# Patient Record
Sex: Male | Born: 1955 | Race: Black or African American | Hispanic: No | Marital: Single | Smoking: Never smoker
Health system: Southern US, Community
[De-identification: ages and names within clinical notes are randomized; demographics above are authoritative.]

## PROBLEM LIST (undated history)

## (undated) HISTORY — PX: HIP RESURFACING: SHX1760

---

## 2003-08-26 ENCOUNTER — Ambulatory Visit (HOSPITAL_COMMUNITY): Admission: RE | Admit: 2003-08-26 | Discharge: 2003-08-26 | Payer: Self-pay | Admitting: Pulmonary Disease

## 2005-12-20 ENCOUNTER — Ambulatory Visit (HOSPITAL_COMMUNITY): Admission: RE | Admit: 2005-12-20 | Discharge: 2005-12-20 | Payer: Self-pay | Admitting: Pulmonary Disease

## 2006-08-01 ENCOUNTER — Emergency Department (HOSPITAL_COMMUNITY): Admission: EM | Admit: 2006-08-01 | Discharge: 2006-08-01 | Payer: Self-pay | Admitting: Emergency Medicine

## 2006-11-08 ENCOUNTER — Ambulatory Visit (HOSPITAL_COMMUNITY): Admission: RE | Admit: 2006-11-08 | Discharge: 2006-11-08 | Payer: Self-pay | Admitting: Pulmonary Disease

## 2007-02-19 ENCOUNTER — Inpatient Hospital Stay (HOSPITAL_COMMUNITY): Admission: EM | Admit: 2007-02-19 | Discharge: 2007-02-26 | Payer: Self-pay | Admitting: Emergency Medicine

## 2007-02-20 ENCOUNTER — Encounter (INDEPENDENT_AMBULATORY_CARE_PROVIDER_SITE_OTHER): Payer: Self-pay | Admitting: Gastroenterology

## 2007-02-24 ENCOUNTER — Encounter (INDEPENDENT_AMBULATORY_CARE_PROVIDER_SITE_OTHER): Payer: Self-pay | Admitting: Interventional Radiology

## 2007-03-03 ENCOUNTER — Ambulatory Visit: Payer: Self-pay | Admitting: Oncology

## 2008-09-22 IMAGING — US US RENAL
1 series · 14 of 20 positions shown · non-contrast
Comparison: None.

CLINICAL DATA: 51-year-old, dehydration, gastroenteritis, elevated BUN and creatinine.  Acute renal failure.
 RENAL/URINARY TRACT ULTRASOUND:
TECHNIQUE: Complete ultrasound examination of the urinary tract was performed including evaluation of the kidneys, renal collecting systems, and urinary bladder.

[Series 1: unknown · 0.33mm/px · 14 of 20 slices shown]
[im 1/20]
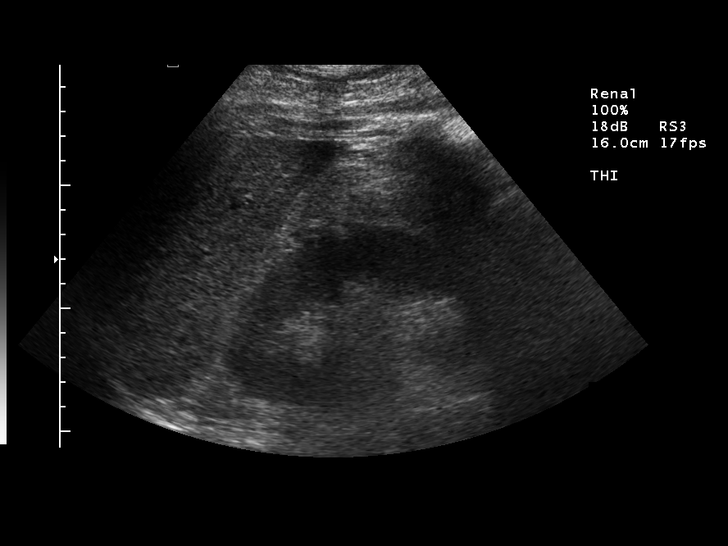
[im 3/20]
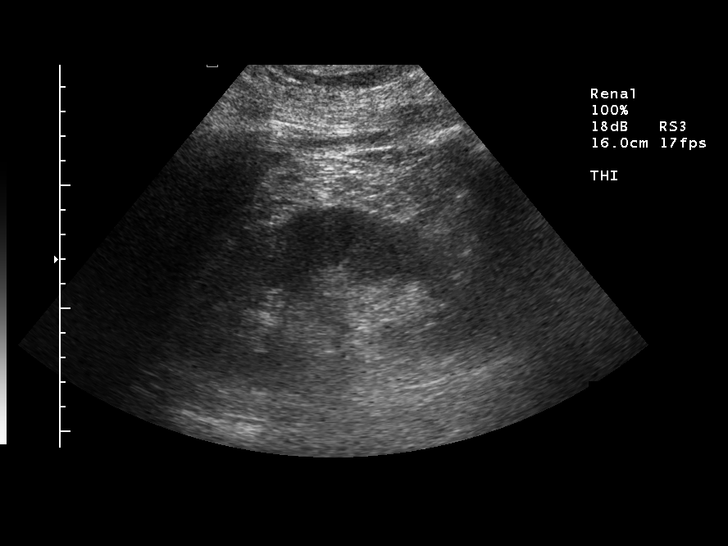
[im 4/20]
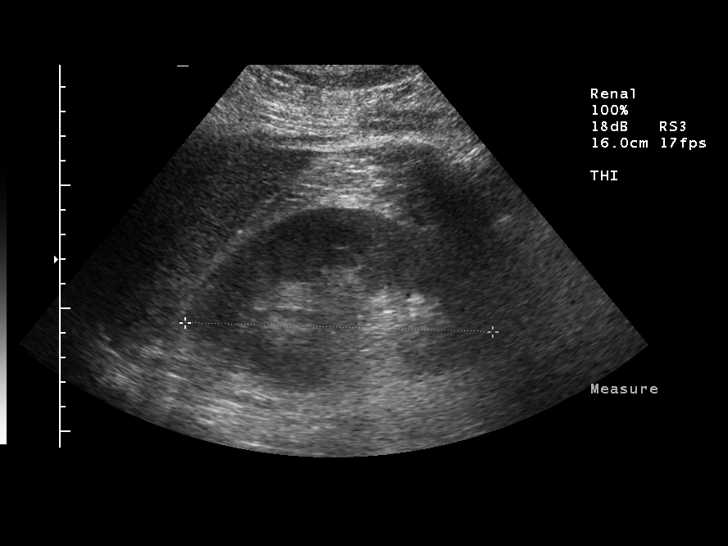
[im 6/20]
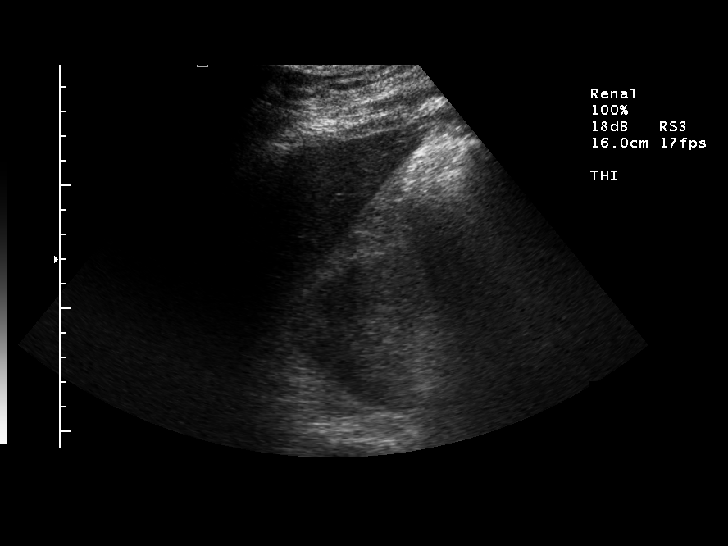
[im 7/20]
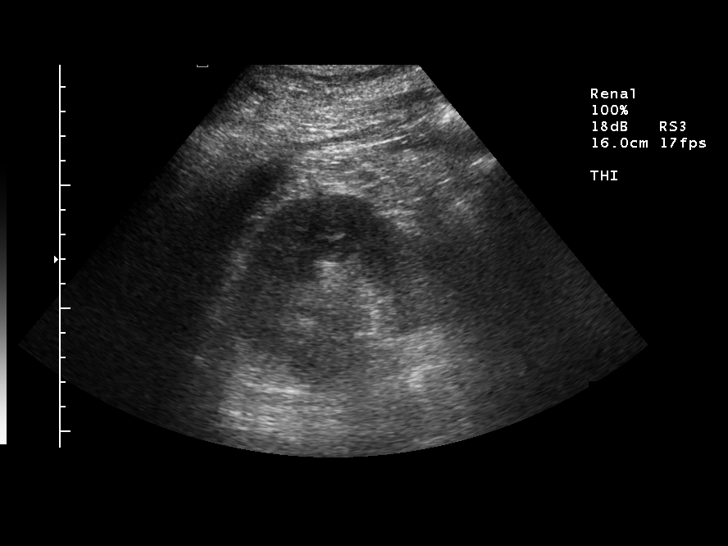
[im 8/20]
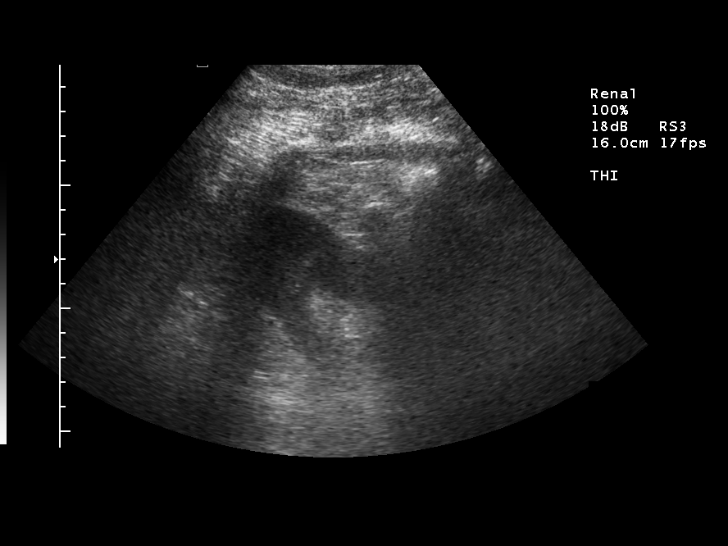
[im 10/20]
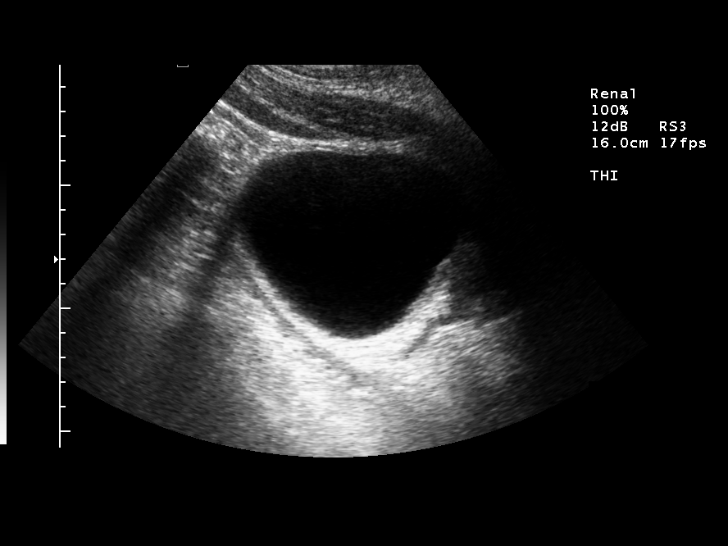
[im 11/20]
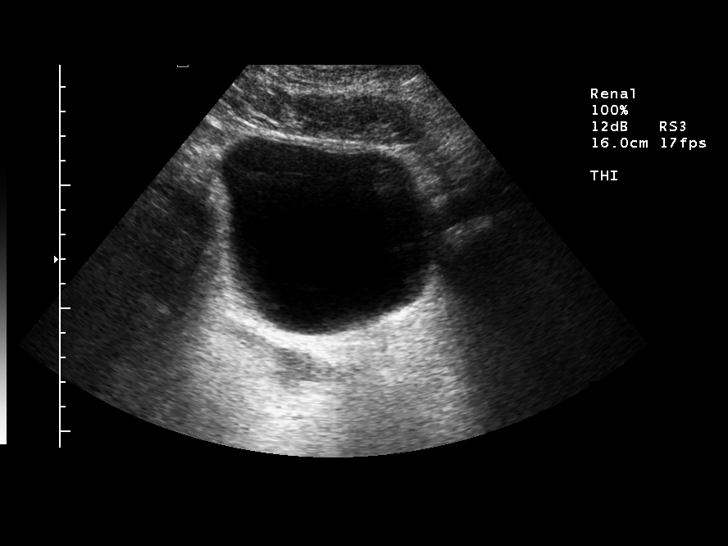
[im 13/20]
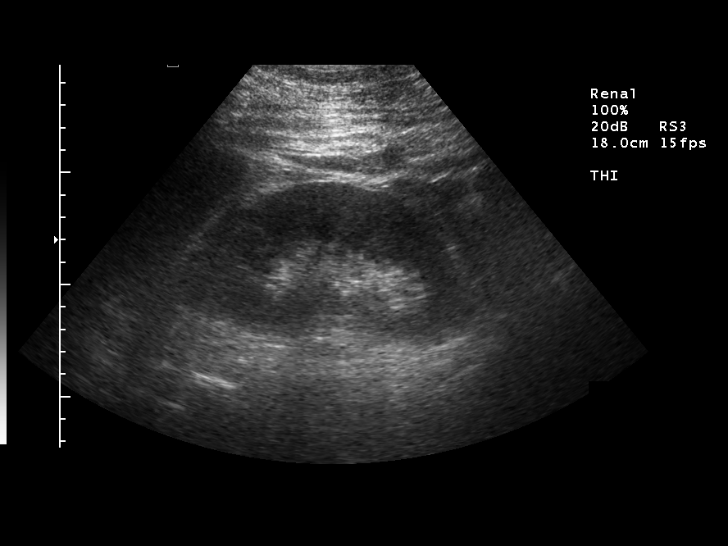
[im 14/20]
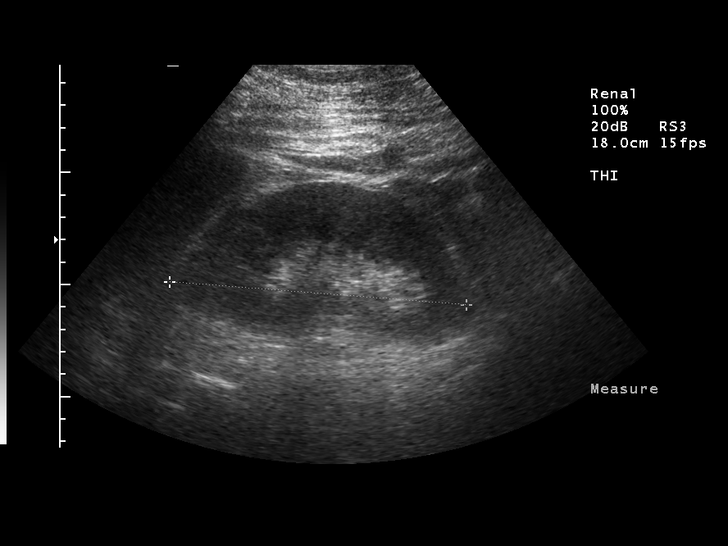
[im 16/20]
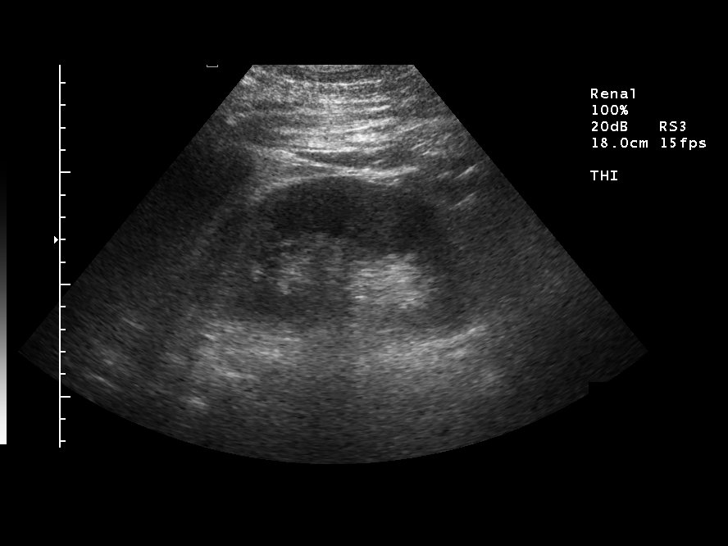
[im 17/20]
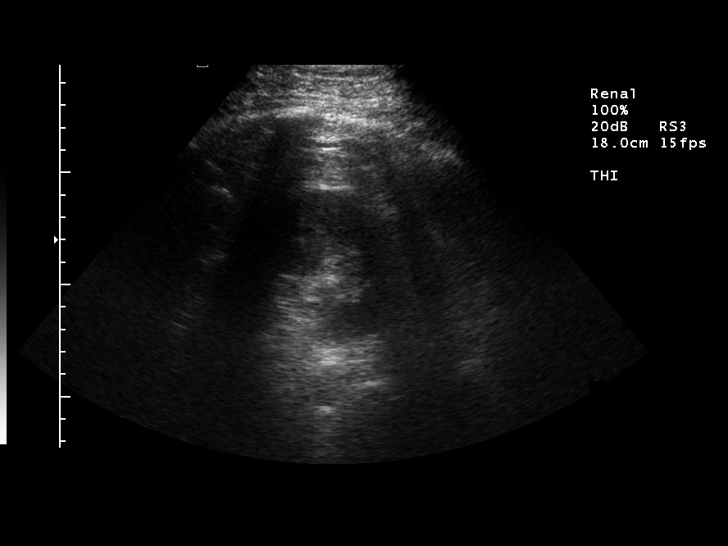
[im 18/20]
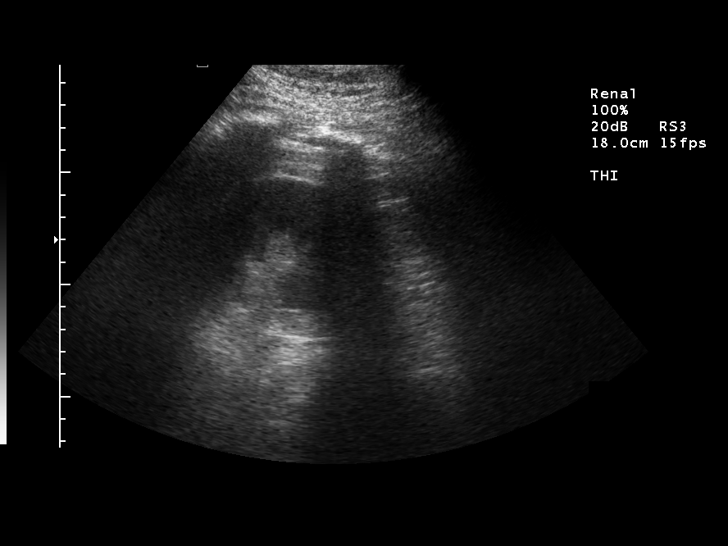
[im 20/20]
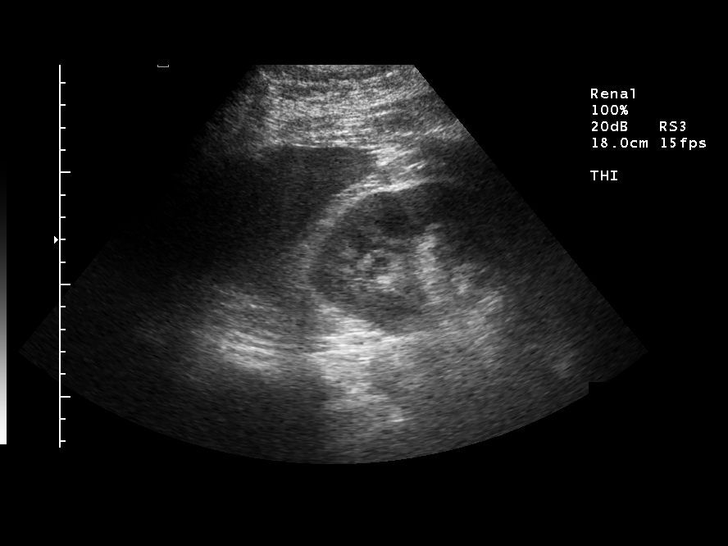

[14 of 20 positions shown; findings below may reference images not displayed]

FINDINGS: The right kidney measures 12.5 cm, and the left kidney measures 13.3 cm.  Both kidneys demonstrate normal echogenicity.  Normal renal cortical thickness.  No renal masses or hydronephrosis.  No perinephric fluid collections.  The bladder is normal.
IMPRESSION: Unremarkable renal ultrasound examination.

## 2008-09-25 IMAGING — US US BIOPSY
1 series · 6 of 6 positions shown · non-contrast
Comparison: none

CLINICAL DATA: Nephrotic syndrome and renal insufficiency.  The patient requires a random biopsy of the kidney.
 ULTRASOUND-GUIDED CORE BIOPSY OF THE LEFT KIDNEY ? 02/24/07:  
 Prior to the procedure, informed consent was obtained from the patient.
 Sedation:  3 mg IV versed, 100 mcg fentanyl.
 Total moderate sedation time:  15 minutes.
 The patient was placed in a prone position.  Ultrasound was performed of both kidneys.  The left was chosen for biopsy.  After marking the skin, the skin was sterilely prepped and draped.  Local anesthesia was provided with 1% lidocaine.
 A 16-gauge core device was advanced under direct ultrasound guidance into the lower pole cortex of the left kidney.  Core biopsy samples were obtained.  Three core samples were obtained with the automated device with three total passes performed.  The kidney was imaged after biopsies were taken.  Material was submitted in saline for nephropathology.  
 Complications:  None.

[Series 1: unknown · 0.35mm/px · 6 of 6 slices shown]
[im 1/6]
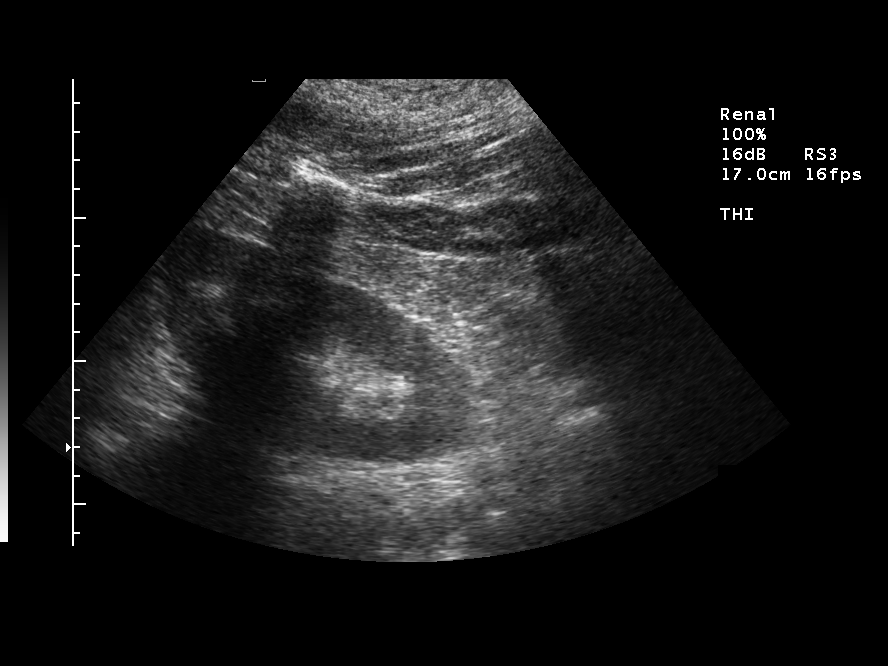
[im 2/6]
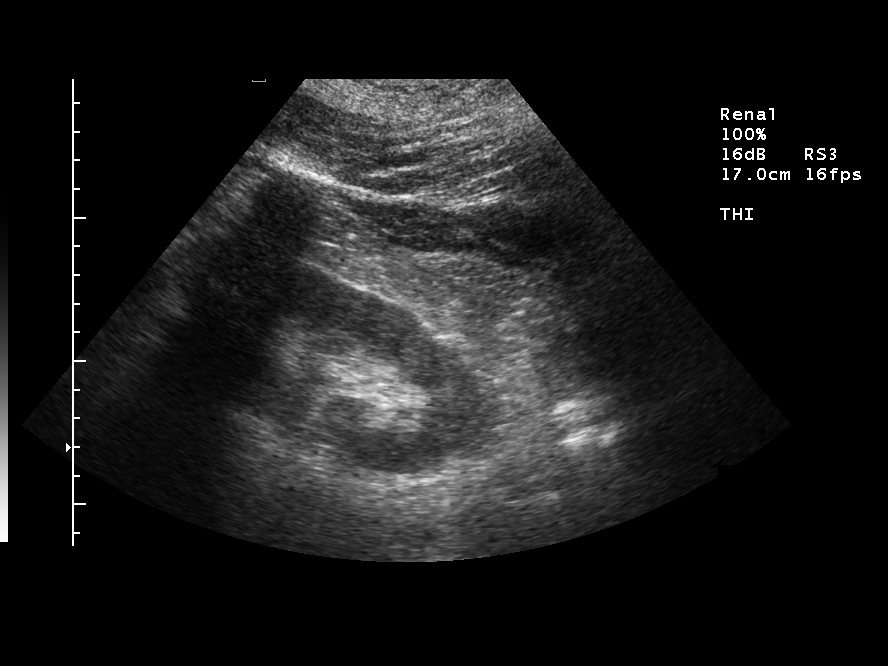
[im 3/6]
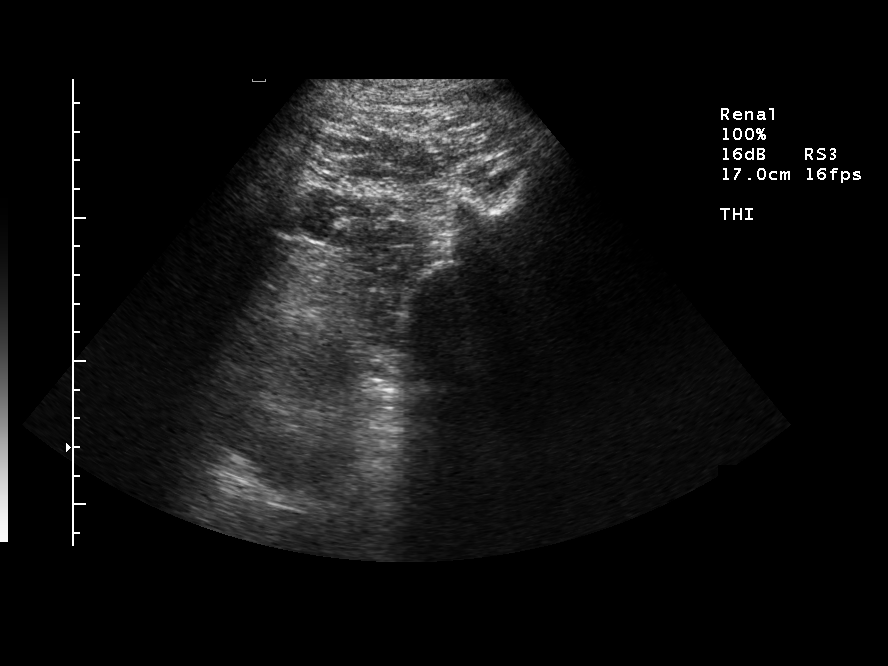
[im 4/6]
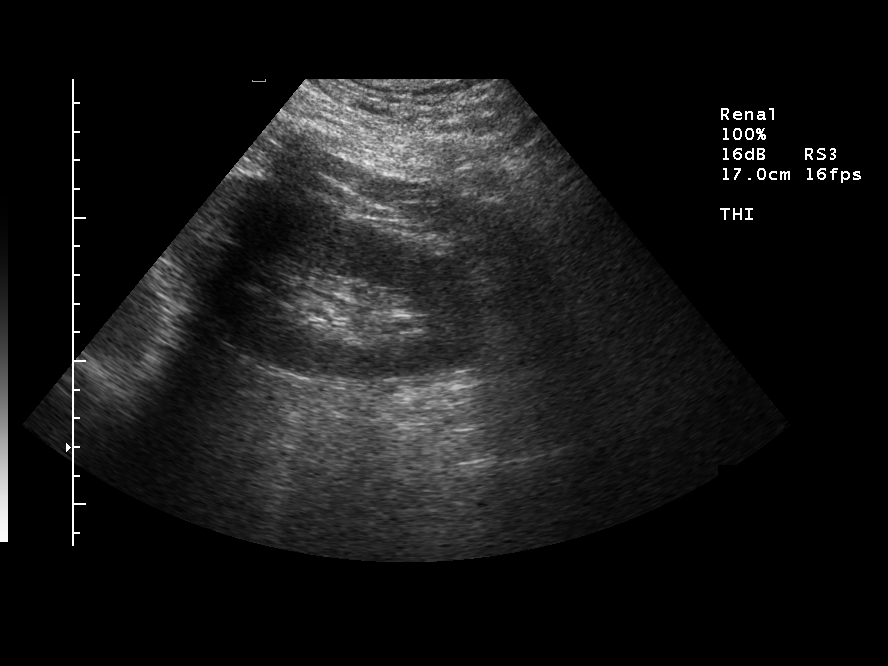
[im 5/6]
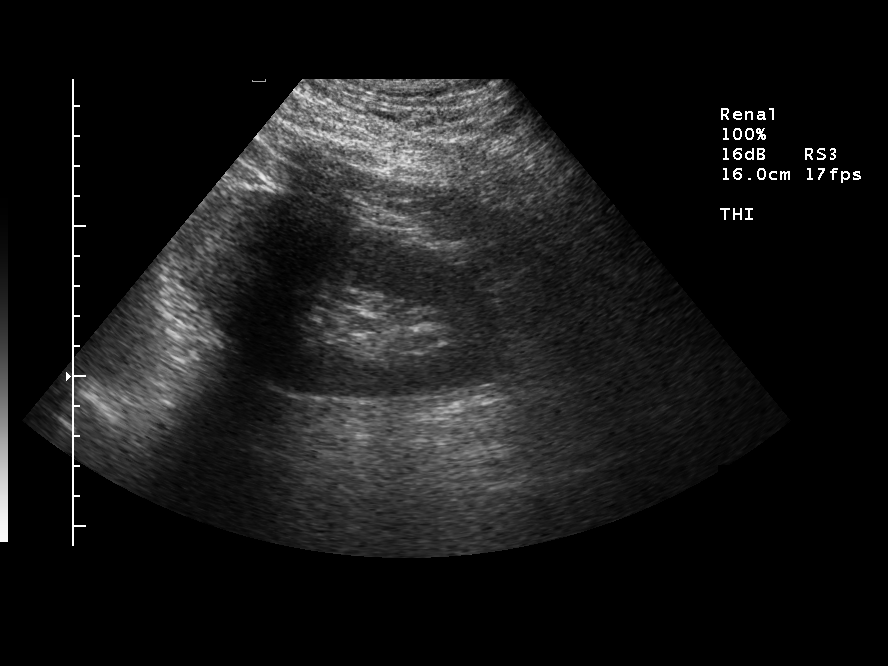
[im 6/6]
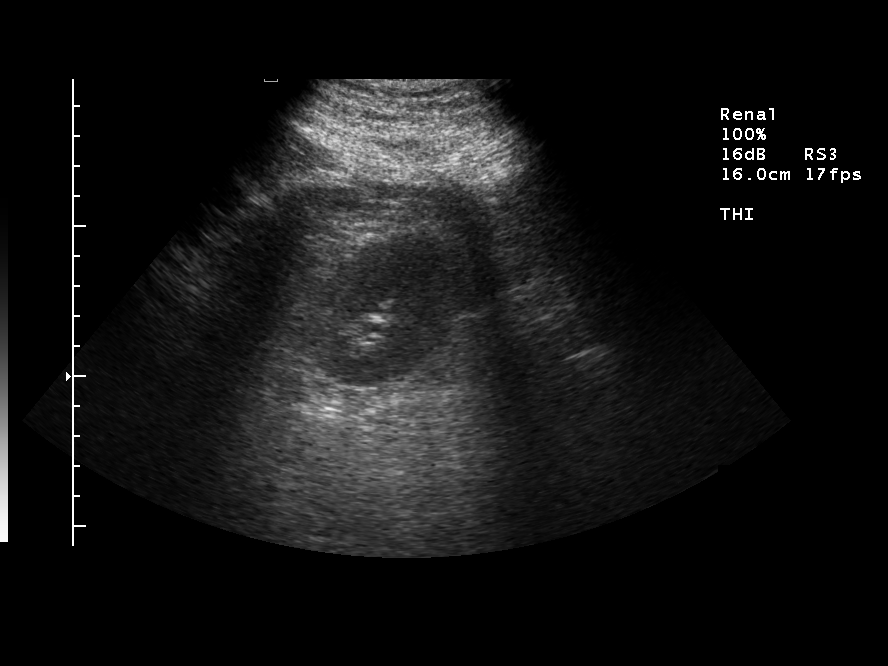

[6 of 6 positions shown; findings below may reference images not displayed]

FINDINGS: Between left and right, the lower pole cortex was slightly more favorable on the left for percutaneous biopsy.  Three adequate 16-gauge core samples were obtained from the lower pole cortex.  There were no immediate bleeding complications.  The patient is currently an inpatient and will be observed overnight following the procedure.
IMPRESSION: Ultrasound guided random core biopsy of the left kidney.  Three 16-gauge core samples were obtained from the lower pole cortex.

## 2010-06-21 ENCOUNTER — Emergency Department (HOSPITAL_COMMUNITY): Payer: BC Managed Care – PPO

## 2010-06-21 ENCOUNTER — Emergency Department (HOSPITAL_COMMUNITY)
Admission: EM | Admit: 2010-06-21 | Discharge: 2010-06-21 | Disposition: A | Discharge status: Home / Self Care | Payer: BC Managed Care – PPO | Attending: Emergency Medicine | Admitting: Emergency Medicine

## 2010-06-21 DIAGNOSIS — E785 Hyperlipidemia, unspecified: Secondary | ICD-10-CM | POA: Insufficient documentation

## 2010-06-21 DIAGNOSIS — R0989 Other specified symptoms and signs involving the circulatory and respiratory systems: Secondary | ICD-10-CM | POA: Insufficient documentation

## 2010-06-21 DIAGNOSIS — R0609 Other forms of dyspnea: Secondary | ICD-10-CM | POA: Insufficient documentation

## 2010-06-21 DIAGNOSIS — M1990 Unspecified osteoarthritis, unspecified site: Secondary | ICD-10-CM | POA: Insufficient documentation

## 2010-06-21 DIAGNOSIS — J45909 Unspecified asthma, uncomplicated: Secondary | ICD-10-CM | POA: Insufficient documentation

## 2010-06-27 NOTE — Consult Note (Signed)
NAMEAXEL, FRISK NO.:  [id number]   MEDICAL RECORD NO.:  [id number]          PATIENT TYPE:  INP   LOCATION:  1345                         FACILITY:  [facility]   PHYSICIAN:  Garnetta Buddy, M.D.   DATE OF BIRTH:  08/17/1955   DATE OF CONSULTATION:  DATE OF DISCHARGE:                                 CONSULTATION   REASON FOR RENAL CONSULTATION:  Elevated serum creatinine, previously  healthy African American male with known history of alcohol use.   He was admitted with a 10-day history of bloody diarrhea and vomiting.  Recent diagnosis of hypertension, treated with HCTZ, took 2 Aleve pills  prior to admission.  Was felt to volume depleted on admission with  increased BUN and creatinine.  His previous serum creatinine was  measured _______1.2mg /dl___  June 2008.  He does have a prior history of  alcohol use, however, he has been abstinent over the last 6 months.  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   PAST MEDICAL HISTORY:  1. Hypertension.  2. DJD.  3. Hyperlipidemia.  4. Allergic rhinitis.   MEDICATIONS:  1. Nasonex 2 sprays daily.  2. Symbicort 2 puffs daily.  3. Protonix 40 mg daily.   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   FAMILY HISTORY:  Sister with sickle cell trait.   ALLERGIES:  No known drug allergies.   REVIEW OF SYSTEMS:  Low grade fever, no sweats, chills.  EYES:  He wears  glasses.  No visual complaints.  EARS/NOSE/MOUTH/THROAT:  No hearing  loss, rhinitis, or epistaxis.  CARDIOVASCULAR:  No angina, history of  ankle swelling for at least one month prior to admission, orthopnea and  dyspnea in the hospital after IV fluids.  RESPIRATORY:  Dry cough,  nonproductive.  ABDOMEN:  Abdominal distention.  Nausea, vomiting,  diarrhea are improved.  UROGENITAL:  No urgency, nocturia, no frequency  or hematuria, has post voiding drip.  CNS:  No  headache, some  paraesthesias of his hands over the past 1-2 months.  INFECTIOUS  DISEASE:  History of Cipro use prior to admission.  DERMATOLOGIC:  No  skin rash or itching.   PHYSICAL EXAMINATION:  GENERAL:  Alert, pleasant gentleman sitting in  bed.  VITAL SIGNS:  Blood pressure 130/80, pulse of 80, temperature 97.7, sat  99%.  Fluid balance:  Positive 5 liters in 2 days.  HEENT:  Normocephalic and atraumatic.  Pupils round, equal, reactive.  No icterus, pallor.  Funduscopic evaluation benign.  Ears, nose, mouth,  throat normal in appearance.  Oropharynx clear.  NECK:  JVP mildly elevated.  CARDIOVASCULAR:  Regular rate and rhythm.  No murmurs, rubs, or gallops.  No heaves or thrills.  Carotid pulses were full.  RESPIRATORY:  Lungs were clear anteriorly, decreased at bases with fine  inspiratory crackles.  ABDOMEN:  Soft.  Bowel sounds active.  No hepatosplenomegaly.  UROGENITAL:  Deferred.  EXTREMITIES:  Reveal 3 plus edema.   Hemoglobin 13.8, platelets 302, WBC 10.1.  Sodium  141, potassium 4.6,  chloride 116, CO2 20, BUN 37, creatinine 2.3, glucose 89, calcium 6.6.  Urine sodium 53, albumin 1.1.  PT 1.2.  Urinalysis negative for blood  and protein.   ASSESSMENT/PLAN:  1. Acute renal failure with normal serum creatinine in June 2008,      impaired renal function despite intravenous fluid administration,      urinalysis __bland________  .  No proteinuria, no hematuria which      would suggest against__________  active glomerular nephritis.  It      is unlikely to represent a HUS or TTP picture as there is no      decrease in platelets on _blood count_________  .  Obstruction is      less likely but renal ultrasound is pending.  2. Profound hypoalbuminemia with no apparent proteinuria, would      question whether this is related to cirrhosis or protein-loosing      enteropathy . Will check_  a computed tomography scan with oral      contrast __and________  follow through the  small bowel.  Also a      celiac disease screen would also be indicated.  Gastroenterology      has already evaluated the patient and Dr. Randa Evens has performed an      esophagogastroduodenoscopy.  The patient may also benefit from      small-bowel follow-through as well as a colonoscopy.  I will leave      this to the discretion of Dr. Randa Evens.  3. Volume positive balance 5 liters.  We will discontinue intravenous      fluids and we will also treat with Lasix 80 mg twice a day.  We      will need to follow his input and output closely.  I discussed the      case with Dr. Janee Morn who is in agreement with recommendations of      gastroenterology consult, computed tomography scan of the abdomen      and pelvis.      Garnetta Buddy, M.D.  Electronically Signed     MWW/MEDQ  D:  02/21/2007  T:  02/21/2007  Job:  [id number]

## 2010-06-27 NOTE — H&P (Signed)
NAMEBRYDON, SPAHR            ACCOUNT NO.:  [id number]   MEDICAL RECORD NO.:  [id number]          PATIENT TYPE:  INP   LOCATION:  1345                         FACILITY:  [facility]   PHYSICIAN:  Ramiro Harvest, MD    DATE OF BIRTH:  [birth year]   DATE OF ADMISSION:  02/19/2007  DATE OF DISCHARGE:                              HISTORY & PHYSICAL   ADDENDUM   For hematemesis and melena, will also type and cross 2 units of packed  red blood cells and follow her H&H.   It has been a pleasure taking care of Mr. Troy.      Ramiro Harvest, MD  Electronically Signed     DT/MEDQ  D:  02/19/2007  T:  02/20/2007  Job:  [id number]

## 2010-06-27 NOTE — Op Note (Signed)
NAMEKASON, BENAK            ACCOUNT NO.:  [id number]   MEDICAL RECORD NO.:  [id number]          PATIENT TYPE:  INP   LOCATION:  1345                         FACILITY:  [facility]   PHYSICIAN:  Petra Kuba, M.D.    DATE OF BIRTH:  1955/04/12   DATE OF PROCEDURE:  02/20/2007  DATE OF DISCHARGE:                               OPERATIVE REPORT   PROCEDURE:  EGD with biopsy.   INDICATION:  Upper GI bleeding.  Consent was signed after risks,  benefits, methods, options thoroughly discussed by Dr. Randa Evens and  myself prior to any premeds given as well as with his sister.   MEDICINES USED:  1. Fentanyl 100 mcg.  2. Versed 8 mg.   PROCEDURE:  The video endoscope was inserted by direct vision.  The  esophagus was normal.  He did have a tiny hiatal hernia but no Mallory-  Weiss tear.  Scope was inserted into the stomach, no blood was seen,  advanced to the antrum.  There were some tiny antral erosions and a few  baby ulcers were seen.  A few scattered biopsies were obtained at the  end of the procedure as well as to the proximal stomach to rule out  Helicobacter.  Scope passed into the bulb and there was some minimal  bulbitis and a few bulb erosions.  Scope was advanced around the C-loop  to a normal second, possibly third, part of the duodenum, no blood was  seen distally.  The scope was slowly withdrawn back to the bulb and a  good look there ruled out any significant at risk lesions.  Scope was  withdrawn back to the stomach and retroflexed.  Angularis, cardia,  fundus, lesser and greater curve were normal on retroflexed  visualization.  Straight visualization in the stomach did not reveal any  additional findings.  Biopsies of the antrum and proximal stomach were  obtained at this time.  Air was suctioned, scope slowly withdrawn.  Again, a good look at the esophagus was normal.  Scope was removed.  Patient tolerated the procedure well.  There was no obvious immediate   complication.   ENDOSCOPIC DIAGNOSES:  1. Tiny hiatal hernia, no Mallory-Weiss tear.  2. Antral erosions and baby ulcers, status post biopsy.  3. Minimal bulbitis and bulb erosions.  4. Otherwise within normal limits to the second or third part of the      duodenum without any blood being seen, status post proximal stomach      biopsies to rule out Helicobacter.   PLAN:  1. Await pathology.  2. Pump inhibitor.  3. Slowly advance diet.  4. Get all labs from Urgent Care to see what his BUN and creatinine      were and if not getting better tomorrow will      need renal workup.  5. No aspirin or nonsteroidals and if in the future he needs to go      back on them make sure care with this as well as possibly      protecting his stomach with pump inhibitors at that junction.  ______________________________  Petra Kuba, M.D.     MEM/MEDQ  D:  02/20/2007  T:  02/20/2007  Job:  [id number]   cc:   Ramiro Harvest, MD

## 2010-06-27 NOTE — Consult Note (Signed)
Aaron Cordova, Aaron Cordova            ACCOUNT NO.:  [id number]   MEDICAL RECORD NO.:  [id number]          PATIENT TYPE:  INP   LOCATION:  1345                         FACILITY:  [facility]   PHYSICIAN:  James L. Malon Kindle., M.D.DATE OF BIRTH:  [birth year]   DATE OF CONSULTATION:  02/19/2007  DATE OF DISCHARGE:                                 CONSULTATION   REASON FOR CONSULTATION:  Hematemesis.   PRIMARY CARE PHYSICIAN:  Dr. Ramiro Harvest.   HISTORY:  This is a 55 year old African-American male who has a history  of alcohol use.  He quit drinking nearly completely six months ago.  He  has an occasional glass of wine.  He has had a 10-day history of  diarrhea, nonbloody, four to five loose stools, occasionally dark.  This  been going on for 10 days.  No exotic travel.  No animals.  He has had  decreased p.o. intake due to nausea associated with this.  For two days,  he has had nausea and vomiting.  He has taken some Aleve over the past  week.  He is a bit vague on how much.  The reason for taking the Aleve  was for aches, pains, chills, lightheadedness and muscle aches.  He had  nausea Monday and yesterday.  Today, he vomited up coffee ground  material.  He has not had any bright blood out of the rectum, but stools  have been brown.  He does not state that they are particularly tarry.  He has not had any real abdominal pain.  He has had nocturnal stools.  He went to an Urgent Care Center.  Apparently they could not get IV  access and sent him to the emergency room.   MEDICATIONS:  1. Ciprofloxacin was started yesterday, apparently unclear where.  2. Promethazine.  3. Symbicort.  4. Nasacort.  5. Hydrochlorothiazide.   ALLERGIES:  She has no drug allergies.   MEDICAL HISTORY:  1. Chronic esophageal reflux symptoms, on no chronic medicines.  2. Hyperlipidemia.  3. Hypertension.  4. Degenerative hip bone and is scheduled for possible surgery in      March.  5. He has a chronic  cough and has seen Dr. Petra Kuba and Dr. Willa Rough,      allergist, and this has been worked up.  He has had a multitude of      tests, but no one has been able to tell him why he has a chronic      cough.   FAMILY HISTORY:  Parents died of cirrhosis of the liver due to drinking.  He has a sister with sickle cell trait.  A brother died of a gunshot  wound.   SOCIAL HISTORY:  Lives in [place].  Lives alone.  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  He does not smoke.  He drank daily until six months ago.  He now has an occasional glass of wine.   PHYSICAL EXAMINATION:  VITAL SIGNS:  Temperature 98.6, blood pressure  141/87, pulse 67, oxygen saturation 97% on room air.  GENERAL:  Very pleasant African-American male in no acute distress.  HEENT:  Sclerae nonicteric.  HEART:  Regular rate and rhythm without murmurs or gallops.  LUNGS:  Grossly clear.  ABDOMEN:  Soft, nontender.  Normal bowel sounds.   PERTINENT LABS:  Platelet count 235,000.  Fecal occult blood test is  negative.  Hemoglobin 15.1.  Creatinine is up to 2.3 with BUN 42.   IMPRESSIONS:  1. Hematemesis with possible melena.  This could be all due to      nonsteroidal drugs.  He has been taking some Aleve.  It might have      been that he has an illness and had a Mallory-Weiss tear.  His      hemoglobin is stable, and he is certainly not acutely bleeding.  At      this point, I would treat him with Protonix and take a quick look      to see what may be going on.  2. Diarrhea.  This is unlikely to be a viral gastroenteritis.  He has      not had antibiotics in the past two or three months.  It is not      really clear what this is, but we need to work this up for culture,      C. diff., etc.  3. Azotemia, probably due to dehydration.  4. Chronic cough and allergic rhinitis.  This is been worked up by an      Proofreader and Dr. Petra Kuba.  5. History of esophageal reflux.   RECOMMENDATIONS:  Agree with admitting and giving IV  Protonix.  Will  plan on endoscopy at some point tomorrow.  I have discussed this with  the patient in detail.           ______________________________  Llana Aliment. Malon Kindle., M.D.     Waldron Session  D:  02/19/2007  T:  02/20/2007  Job:  [id number]   cc:   Deboraha Sprang

## 2010-06-27 NOTE — H&P (Signed)
Aaron Cordova, DOCKEN NO.:  [id number]   MEDICAL RECORD NO.:  [id number]          PATIENT TYPE:  INP   LOCATION:  1345                         FACILITY:  [facility]   PHYSICIAN:  Ramiro Harvest, MD    DATE OF BIRTH:  April 22, 1955   DATE OF ADMISSION:  02/19/2007  DATE OF DISCHARGE:                              HISTORY & PHYSICAL   PRIMARY CARE PHYSICIAN:  Theatre stage manager at [facility].  The patient  states his PCP is Dr. Arthor Captain.   CHIEF COMPLAINT:  Nausea, vomiting, and diarrhea.   HISTORY OF PRESENT ILLNESS:  Donya Tomaro is a 51-year African  American male with past medical history of alcohol use/abuse, quit 6  months ago, history of gastroesophageal reflux disease, history of NSAID  use, Aleve, last use was February 15, 2007, and hypertension, who presents  to the ED from urgent care with a 10-day history of nausea, diarrhea  which is occasionally black and tar-like, decreased p.o. intake,  intermittent left lower quadrant abdominal pain lasting seconds,  fatigue, a feeling of being run down, positive chills, positive  lightheadedness.  The patient states over the last 2 days he had emesis,  and on the day of admission had hematemesis.  No chest pain or shortness  of breath, no palpitations, no fever, no other associated symptoms.  No  history of peptic ulcer disease.  No prior history of melena or  hematemesis.  The patient went to the urgent care 2 days prior to  admission and was put on Cipro, promethazine, and HCTZ. The patient  returned a call to urgent care today, as he was called and told to come  into urgent care secondary to dehydration.  The patient, on return to  urgent care, told him of his hematemesis.  At urgent care, they unable  to get IV access, and as such the patient was sent to the ED for further  evaluation.  In the ED, the ED physician on call us for admission for  further evaluation and management.   ALLERGIES:  NO KNOWN DRUG  ALLERGIES.   PAST MEDICAL HISTORY:  1. Gastroesophageal reflux disease.  2. Allergic rhinitis.  3. Hyperlipidemia.  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  Scheduled for outpatient      surgical consult, March 2009.  5. Hypertension.   HOME MEDICATIONS:  1. Symbicort 2 puffs daily.  Symbicort started 2 months ago.  2. Nasacort 2 sprays per nostril daily over the past 2 months.  3. HCTZ 25 mg daily, started on February 18, 2007.  4. Ciprofloxacin 500 mg daily, started February 18, 2007.  5. promethazine 25 mg tablet 5 times a day, started February 18, 2007.   SOCIAL HISTORY:  The patient lives in [place].  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  No tobacco abuse.  Positive alcohol use in the past.  He  used to drink one cocktail per day weekly, and on the weekend drank a  pint of liquor. The patient quit 6 months ago.  No history of IV drug  use.   FAMILY HISTORY:  Mother deceased  at age 83 from cirrhosis of the liver  secondary to alcohol use.  Father deceased at age 47 from cirrhosis of  the liver secondary to alcohol use.  The patient has one half- sister  who is 12 years old with sickle cell trait, one brother deceased age 51  from a gunshot wound.   REVIEW OF SYSTEMS:  Significant for occasional shortness of breath,  ankle swelling, cough in the supine position, tingling of the  fingertips.  Otherwise, review of systems as per HPI.   PHYSICAL EXAMINATION:  VITAL SIGNS:  Temperature 98.6, blood pressure  141/87, pulse of 67, respiratory rate 20, saturating 97% on room air.  GENERAL: The patient is in no apparent distress.  HEENT: Normocephalic, atraumatic.  Pupils equal, round, and reactive to  light.  Extraocular movements intact.  Oropharynx clear, dry, no  lesions, no exudates.  NECK:  Supple.  No lymphadenopathy.  RESPIRATORY:  Lungs are clear to auscultation bilaterally.  No wheezes,  no rhonchi, no crackles.  CARDIOVASCULAR:  Regular rate and rhythm.  No murmurs, rubs, or  gallops.  ABDOMEN:  Soft, nontender, nondistended.  Positive bowel sounds.  EXTREMITIES:  No clubbing, cyanosis, or edema.  NEUROLOGIC:  The patient is alert and oriented x3.  Cranial nerves II-  XII are grossly intact.  No focal deficits.  Sensation is intact.  Cerebellum is intact.  Gait not tested.   LABORATORIES:  Sodium 137, potassium 5, chloride 112, bicarbonate 19,  BUN 42, creatinine 2.37, glucose of 64, calcium of 7.6.  White count  11.9, hemoglobin 15.1, platelets 235, hematocrit 45.5, ANC of 7.  FOBT  was negative.   ASSESSMENT AND PLAN:  Mr. Izaah Westman is a 56 year old African  American male, past history of alcohol use, history of NSAID use, and  history of gastroesophageal reflux disease, presenting with a 10-day  history of nausea, vomiting, diarrhea which is occasionally melanotic  stools, and also hematemesis on the day of admission.  1. Hematemesis / melena.  Likely upper GI bleed secondary to NSAID      use, likely peptic ulcer disease versus Mallory-Weiss tear, versus      varices, versus gastritis, versus lower GI bleed.  Will hydrate      with IV fluids.  Check CBC q.8 h x3.  Protonix 40 mg IV q.12.      Clear liquid diet, n.p.o. after midnight.  Consult with GI for      possible endoscopy.  Place two large-bore IVs.  2. Diarrhea, likely a viral gastroenteritis, versus medication      induced, versus inflammatory, versus secretory.  Will check stool      cultures for Escherichia coli, enteric pathogens, fecal leukocytes.      Check Clostridium difficile, and give IV fluid hydration, and      follow.  If not an infectious cause of diarrhea may, may consider      starting Imodium p.r.n.  3. Acute renal failure.  Likely prerenal in the setting of diuretic      use , versus renal, versus postrenal.  Check a fractional excretion      of sodium.  IV fluid hydration.  If no improvement, a renal      ultrasound will be checked.  Follow creatinine.  4.  Hypertension.  Hold BP medications secondary to problem #1.  5. Allergic rhinitis.  Symbicort and Nasacort.  6. Hyperlipidemia.  Check a fasting lipid panel in the morning.  7. Gastroesophageal reflux disease.  Protonix.  8. Prophylaxis.  Protonix for GI.  SCDs for DVT.   It has been a pleasure taking care of Mr. Arave.      Ramiro Harvest, MD  Electronically Signed     DT/MEDQ  D:  02/19/2007  T:  02/20/2007  Job:  [id number]

## 2010-06-27 NOTE — Discharge Summary (Signed)
Aaron Cordova, Aaron Cordova NO.:  [id number]   MEDICAL RECORD NO.:  [id number]          PATIENT TYPE:  INP   LOCATION:  1345                         FACILITY:  [facility]   PHYSICIAN:  Ramiro Harvest, MD    DATE OF BIRTH:  Nov 01, 1955   DATE OF ADMISSION:  02/19/2007  DATE OF DISCHARGE:  02/26/2007                               DISCHARGE SUMMARY   PRIMARY CARE PHYSICIAN:  Theatre stage manager at [facility]   DISCHARGE DIAGNOSES:  1. Hematemesis and melena.  2. Renal insufficiency with a nephrotic syndrome.  3. Volume overload.  4. Viral gastroenteritis.  5. Abdominal and retroperitoneal lymphadenopathy.  6. Hypertension.  7. Allergic rhinitis.  8. Hyperlipidemia.  9. Gastroesophageal reflux disease.  10.Degenerative hip disease.   DISCHARGE MEDICATIONS:  1. Lasix 160 mg p.o. 3 times a day.  2. K-Dur 40 mEq p.o. daily x3 days, then per primary care physician.  3. Zocor 40 mg p.o. nightly.  4. Protonix 40 mg p.o. daily.  5. Nasacort 2 sprays in each nostril daily.  6. Symbicort 2 puffs nightly.   DISPOSITION AND FOLLOWUP:  The patient will be discharged home.  The  patient was told to follow up with primary care physician on February 28, 2007, to get lab work for repeat BMET to follow up on patient's  electrolytes.  If the patient's potassium is low, may need to be  continued on K-Dur.  The patient will also need a repeat CT of the  abdomen and pelvis in 6 months to follow up on patient's abdominal and  retroperitoneal lymphadenopathy.  The patient is also scheduled to  follow up with nephrologist, Dr. Hyman Hopes, on March 21, 2007, at 11:15  a.m., and followup biopsy results should be available to follow up on  his kidney biopsy as well as followup on his nephrotic syndrome.  The  patient is also to follow up with Dr. Ewing Schlein as an outpatient in 6 weeks  for his hematemesis and melena.   CONSULTATIONS:  1. Gastroenterology consult was done.  The patient was seen by  [facility]astroenter[facility], Dr. Randa Evens, on February 14, 2007.  2. Nephrology consult was also done.  The patient was seen by Dr.      Hyman Hopes, of [facility], on February 21, 2007.  3. Interventional radiology was consulted for a kidney biopsy on      February 23, 2007.   PROCEDURES:  1. EGD with biopsy done on February 20, 2007,  by Dr. Ewing Schlein which showed      a tiny hiatal hernia, no Mallory-Weiss tear, antral erosions and      baby ulcers status post biopsy, minimal bulbitis and bulb erosions,      otherwise within normal limits to the second or third part of the      duodenum without any blood being seen.  Status post proximal      stomach biopsies to rule out Helicobacter.  2. A chest x-ray was done on February 21, 2007,  that showed bilateral      pleural effusions with basilar compressive atelectasis.  3.  Renal ultrasound done on February 21, 2007, that showed unremarkable      renal ultrasound examination.  4. CT of the abdomen and pelvis was done on February 21, 2007, that      showed abdominal retroperitoneal lymphadenopathy in combination      with mildly prominent size of kidneys, at  least 13 cm in length.      Lymphoma is a consideration.  Infectious or inflammatory process,      particularly HIV or sarcoidosis also considerations.  Other      findings on CT are nonspecific overall edematous state with      bilateral pleural effusions, small amount of ascites, and anasarca      of the body wall, small amount of free fluid in the pelvis.  No      discrete pelvic lymphadenopathy.  5. Renal ultrasound was done on February 24, 2007, and renal biopsy was      done on February 24, 2007, per interventional radiology.   BRIEF HISTORY AND PHYSICAL:  Aaron Cordova is a 55 year old  African-American gentleman with past medical history of alcohol abuse  who quit 6 months prior to admission, history of gastroesophageal reflux  disease, history of NSAID use, last use on February 15, 2007,  and  hypertension who presented to the ED from Urgent Care with a 10-day  history of nausea and diarrhea which was occasionally black and tar  like, decreased p.o. intake.  He had left lower quadrant abdominal pain  lasting seconds, fatigue, feeling of being run down, positive chills,  positive lightheadedness. The patient stated that over the past 2 days  prior to admission he had emesis and on admission had hematemesis.  The  patient denied any chest pain, shortness of breath, palpitations.  No  fever, no other associated symptoms.  No history of prior peptic ulcer  disease, no prior history of melena or hematemesis.  The patient went to  the Urgent Care 2 days prior to admission, was put on Cipro,  Primethasone and hydrochlorothiazide.  The patient returned a call to  the Urgent Care today as he was called and told to come in secondary to  his dehydration.  On return to Urgent Care, he told them he had some  hematemesis.  Urgent Care were unable to get IV access to give the  patient fluids and, as such, was sent to the ED for further evaluation.  In the ED, we were called to admit the patient for further evaluation  and management.   PHYSICAL EXAMINATION:  VITAL SIGNS:  On admission,  temperature 98.6,  blood pressure 141/87, pulse 67, respiratory rate 20, saturation 97% on  room air.  GENERAL:  The patient was in no apparent distress.  HEENT:  Normocephalic and atraumatic.  Pupils equal, round, and reactive  to light.  Extraocular movements intact.  Oropharynx was clear. No  lesions or exudates.  NECK:  Supple.  No lymphadenopathy.  LUNGS:  Clear to auscultation bilaterally.  No wheezes, no rhonchi, no  crackles.  CARDIOVASCULAR:  Regular rate and rhythm.  No murmurs, rubs, or gallops.  ABDOMEN:  Soft, nontender, nondistended.  Positive bowel sounds.  EXTREMITIES:  No clubbing, cyanosis, or edema.  NEUROLOGIC:  The patient was alert and oriented x3.  Cranial nerves II-  XII  grossly intact.  No focal deficits. Sensation was intact.  Cerebellum was intact.  Gait was not tested.   ADMISSION LABORATORY DATA:  Sodium 137, potassium 5, chloride 112,  bicarbonate 19, BUN 14, creatinine 2.37, glucose 64, calcium 7.6.  CBC:  White count 11.9, hemoglobin 15.1, platelets 235, hematocrit 45.5.  AST  of 7.  Fecal occult blood test was negative.   HOSPITAL COURSE:  #1.  HEMATEMESIS WITH MELENA:  The patient was initially admitted, was  placed on bowel rest, was placed on IV Protonix, was given IV fluids.  The patient was typed and cross 2 units packed red blood cells.  CBCs  every 8 hours were obtained on the patient.  The patient did not have  any episodes of hematemesis or melena during the hospitalization.  Gastroenterology was consulted.  The patient was seen in consultation  per Dr. Randa Evens on February 19, 2007.  On February 20, 2007, the patient had  an EKG with biopsy done per Dr. Ewing Schlein.  The patient's CBC remained  stable during the hospitalization.  Protonix was then changed to oral.  Biopsy results were negative for Helicobacter pylori.  The patient was  told to be maintained on Protonix daily and will follow up as an  outpatient with Dr. Ewing Schlein and [facility] in 6 weeks post  discharge.  The patient remained  stable for the rest of the  hospitalization and had no more bouts of melena or hematemesis.  He was  discharged in stable and improved condition.   #2.  RENAL INSUFFICIENCY WITH NEPHROTIC SYNDROME:  The patient came in  acute renal failure.  It was first thought this may be prerenal in  nature.  Initial urinalysis obtained on the patient on the day of  admission was bland.  At that time, it was felt that this may be  secondary to dehydration.  The patient was placed on IV fluids.  The  patient's renal function, however, did not improve.  A renal consult was  obtained.  The patient was seen in consultation per Dr. Hyman Hopes on February 21, 2007.  A  repeat urinalysis was done.  It was also noted that patient  had hypoalbuminemia.  Repeat urinalysis showed significant proteinuria.  The patient was having about 10.3 grams of protein per 24 hours.  CT of  the abdomen and pelvis was also obtained with results as stated above.  It was noted, patient had lymphadenopathy.  The patient was worked up.  ANA was done which was negative.  ANCA was pending by day of discharge.  C3-C4 levels were also obtained which were normal.  SPEP was also  pending.  HIV and RPR were also obtained which were both negative.  It  was also noted patient had 800 mg of free kappa light chains, 412 mg of  free lambda light chains.  A renal biopsy was done per interventional  radiology on February 23, 2007.  It was also noted that patient was  significantly volume overloaded  The patient was diuresed with IV Lasix  and then changed to oral Lasix.  The patient remained asymptomatic  throughout the rest of the hospitalization.  Biopsy results were sent to  [facility] and were pending at the time of discharge.  The patient  was discharged home to follow up with Dr. Hyman Hopes as an outpatient for  further workup of his nephrotic syndrome and followup on biopsy results.  The patient was discharged home in stable condition.   #3.  VOLUME OVERLOAD:  Please see Problem #2.  The patient was diuresed  during the hospitalization and sent home on Lasix 160 mg 3 times a day.  This will be followed up per Dr. Hyman Hopes at [facility].   #4.  VIRAL GASTROENTERITIS:  The patient presented with a viral  gastroenteritis, but during the hospitalization had no further episodes  of diarrhea.  The patient remained stable, and the patient was  discharged home in stable and improved condition.   #5.  ABDOMINAL AND RETROPERITONEAL LYMPHADENOPATHY AS NOTED PER CT:  HIV  panel was obtained which was negative.  A RPR panel was also obtained  which was negative.  ACE level was also obtained that  was pending on day  of discharge. The patient will need repeat followup CT in 6 months for  further followup on lymphadenopathy.  Please seen Problem #2 on renal  insufficiency and nephrotic syndrome.   #6.  HYPERTENSION:  Stable.   The rest of patient's chronic medical issues were stable throughout  hospitalization.  The patient was discharged in stable and improved  condition to follow up with primary care physician and also to follow up  with Dr. Hyman Hopes of [facility] and also to follow up with Dr. Ewing Schlein of  [facility] 6 weeks post discharge.  Vital signs on the day  of discharge, temperature 98.4, pulse 65, respirations 20, blood  pressure 139/85, saturating 99% on room air.  Discharge weight was 88.5  kg.   DISCHARGE LABORATORY DATA:  Basic metabolic profile:  Sodium 139,  potassium 3.6, chloride 103, bicarb 28, glucose 87, BUN 30, creatinine  2.08, calcium 7.7.  CBC on day of discharge:  White count 9.1,  hemoglobin 12.3, hematocrit 36.0, platelet count 315.  Albumin level  less than 1.0.  Phosphorus level was 5.0, calcium 7.5.   It has been a pleasure taking care of Mr. Aaron Cordova.      Ramiro Harvest, MD  Electronically Signed     DT/MEDQ  D:  03/23/2007  T:  03/23/2007  Job:  [id number]   cc:   Garnetta Buddy, M.D.  Fax: [phone]   Petra Kuba, M.D.  Fax: [phone]

## 2010-11-02 LAB — BASIC METABOLIC PANEL
BUN: 30 — ABNORMAL HIGH
BUN: 37 — ABNORMAL HIGH
CO2: 19
Calcium: 6.6 — ABNORMAL LOW
Calcium: 7.7 — ABNORMAL LOW
Creatinine, Ser: 2.08 — ABNORMAL HIGH
GFR calc Af Amer: 33 — ABNORMAL LOW
GFR calc non Af Amer: 27 — ABNORMAL LOW
GFR calc non Af Amer: 30 — ABNORMAL LOW
GFR calc non Af Amer: 34 — ABNORMAL LOW
Glucose, Bld: 64 — ABNORMAL LOW
Glucose, Bld: 87
Glucose, Bld: 89
Potassium: 4.8
Sodium: 137
Sodium: 141

## 2010-11-02 LAB — PROTEIN ELECTROPH W RFLX QUANT IMMUNOGLOBULINS
Beta Globulin: 4.3 — ABNORMAL LOW
Gamma Globulin: 25.8 — ABNORMAL HIGH
M-Spike, %: NOT DETECTED
Total Protein ELP: 5.7 — ABNORMAL LOW

## 2010-11-02 LAB — IGG, IGA, IGM
IgA: 294
IgA: 389 — ABNORMAL HIGH
IgG (Immunoglobin G), Serum: 1160
IgG (Immunoglobin G), Serum: 1830 — ABNORMAL HIGH
IgM, Serum: 64

## 2010-11-02 LAB — CBC
HCT: 32.1 — ABNORMAL LOW
HCT: 34.3 — ABNORMAL LOW
HCT: 35 — ABNORMAL LOW
HCT: 36 — ABNORMAL LOW
HCT: 36.8 — ABNORMAL LOW
HCT: 37.9 — ABNORMAL LOW
HCT: 41
HCT: 41.7
HCT: 45.5
Hemoglobin: 10.8 — ABNORMAL LOW
Hemoglobin: 11.7 — ABNORMAL LOW
Hemoglobin: 11.9 — ABNORMAL LOW
Hemoglobin: 12.8 — ABNORMAL LOW
Hemoglobin: 13.8
Hemoglobin: 13.8
Hemoglobin: 14.2
Hemoglobin: 14.2
Hemoglobin: 15.1
MCHC: 33.1
MCHC: 33.6
MCHC: 33.6
MCHC: 33.8
MCHC: 33.8
MCV: 77.8 — ABNORMAL LOW
MCV: 78.2
MCV: 78.7
Platelets: 220
Platelets: 235
Platelets: 249
Platelets: 263
Platelets: 307
Platelets: 315
Platelets: 317
Platelets: 326
Platelets: 329
RBC: 4.11 — ABNORMAL LOW
RBC: 4.38
RBC: 4.66
RBC: 4.88
RBC: 5.11
RBC: 5.14
RBC: 5.79
RDW: 14.8
RDW: 15
RDW: 15.3
RDW: 15.3
RDW: 15.5
RDW: 15.6 — ABNORMAL HIGH
RDW: 15.7 — ABNORMAL HIGH
RDW: 15.7 — ABNORMAL HIGH
RDW: 15.8 — ABNORMAL HIGH
RDW: 16 — ABNORMAL HIGH
WBC: 10.8 — ABNORMAL HIGH
WBC: 11 — ABNORMAL HIGH
WBC: 11 — ABNORMAL HIGH
WBC: 13.6 — ABNORMAL HIGH
WBC: 8.7
WBC: 8.8
WBC: 9.1

## 2010-11-02 LAB — RENAL FUNCTION PANEL
Albumin: <1 — ABNORMAL LOW
Albumin: <1 — ABNORMAL LOW
Albumin: <1 — ABNORMAL LOW
BUN: 28 — ABNORMAL HIGH
BUN: 29 — ABNORMAL HIGH
BUN: 31 — ABNORMAL HIGH
BUN: 34 — ABNORMAL HIGH
CO2: 20
CO2: 22
Calcium: 7 — ABNORMAL LOW
Calcium: 7 — ABNORMAL LOW
Calcium: 7.2 — ABNORMAL LOW
Chloride: 107
Chloride: 115 — ABNORMAL HIGH
Creatinine, Ser: 2.36 — ABNORMAL HIGH
Creatinine, Ser: 2.42 — ABNORMAL HIGH
Creatinine, Ser: 2.45 — ABNORMAL HIGH
GFR calc Af Amer: 35 — ABNORMAL LOW
GFR calc Af Amer: 36 — ABNORMAL LOW
GFR calc Af Amer: 37 — ABNORMAL LOW
GFR calc non Af Amer: 29 — ABNORMAL LOW
GFR calc non Af Amer: 29 — ABNORMAL LOW
GFR calc non Af Amer: 30 — ABNORMAL LOW
Glucose, Bld: 110 — ABNORMAL HIGH
Phosphorus: 5 — ABNORMAL HIGH
Phosphorus: 5.9 — ABNORMAL HIGH
Phosphorus: 6.4 — ABNORMAL HIGH
Phosphorus: 6.4 — ABNORMAL HIGH
Potassium: 3.1 — ABNORMAL LOW
Potassium: 3.5
Sodium: 138
Sodium: 143

## 2010-11-02 LAB — C4 COMPLEMENT: Complement C4, Body Fluid: 22

## 2010-11-02 LAB — URINE CULTURE: Colony Count: NO GROWTH

## 2010-11-02 LAB — MAGNESIUM
Magnesium: 2
Magnesium: 2.9 — ABNORMAL HIGH

## 2010-11-02 LAB — UIFE/LIGHT CHAINS/TP QN, 24-HR UR
Alpha 2, Urine: DETECTED — AB
Beta, Urine: DETECTED — AB
Free Kappa Lt Chains,Ur: 24.6 — ABNORMAL HIGH (ref 0.04–1.51)
Free Lt Chn Excr Rate: 811.8
Total Protein, Urine: 314.1

## 2010-11-02 LAB — URINALYSIS, ROUTINE W REFLEX MICROSCOPIC
Bilirubin Urine: NEGATIVE
Glucose, UA: 100 — AB
Glucose, UA: NEGATIVE
Nitrite: NEGATIVE
Specific Gravity, Urine: 1.019
Specific Gravity, Urine: 1.02
pH: 6
pH: 6

## 2010-11-02 LAB — URINE MICROSCOPIC-ADD ON

## 2010-11-02 LAB — LIPID PANEL
HDL: 21 — ABNORMAL LOW
Triglycerides: 236 — ABNORMAL HIGH
VLDL: 47 — ABNORMAL HIGH

## 2010-11-02 LAB — DIFFERENTIAL
Basophils Relative: 0
Basophils Relative: 0
Basophils Relative: 2 — ABNORMAL HIGH
Eosinophils Relative: 6 — ABNORMAL HIGH
Lymphocytes Relative: 25
Lymphs Abs: 2.7
Lymphs Abs: 3.2
Monocytes Absolute: 1.6 — ABNORMAL HIGH
Monocytes Relative: 12
Monocytes Relative: 13 — ABNORMAL HIGH
Neutro Abs: 5.7
Neutro Abs: 7
Neutro Abs: 8 — ABNORMAL HIGH
Neutrophils Relative %: 59
Neutrophils Relative %: 59

## 2010-11-02 LAB — PROTIME-INR: Prothrombin Time: 14.3

## 2010-11-02 LAB — HEPATIC FUNCTION PANEL
Albumin: 1.1 — ABNORMAL LOW
Bilirubin, Direct: 0.2
Indirect Bilirubin: 0.6

## 2010-11-02 LAB — CLOSTRIDIUM DIFFICILE EIA

## 2010-11-02 LAB — CREATININE, URINE, RANDOM: Creatinine, Urine: 85.1

## 2010-11-02 LAB — HEPATITIS PANEL, ACUTE
HCV Ab: NEGATIVE
Hep A IgM: NEGATIVE
Hepatitis B Surface Ag: NEGATIVE

## 2010-11-02 LAB — FECAL LACTOFERRIN, QUANT: Fecal Lactoferrin: NEGATIVE

## 2010-11-02 LAB — STOOL CULTURE

## 2010-11-02 LAB — ANGIOTENSIN CONVERTING ENZYME: Angiotensin-Converting Enzyme: 29 U/L (ref 9–67)

## 2010-11-02 LAB — BLEEDING TIME: Bleeding Time: 4

## 2010-11-02 LAB — EHEC TOXIN BY EIA, STOOL: EHEC Toxin by EIA: NEGATIVE

## 2010-11-02 LAB — OCCULT BLOOD X 1 CARD TO LAB, STOOL: Fecal Occult Bld: NEGATIVE

## 2010-11-02 LAB — C3 COMPLEMENT: C3 Complement: 131

## 2010-11-02 LAB — APTT: aPTT: 28

## 2010-11-02 LAB — RPR: RPR Ser Ql: NONREACTIVE

## 2010-11-29 LAB — I-STAT 8, (EC8 V) (CONVERTED LAB)
Bicarbonate: 23.4
Glucose, Bld: 92
Sodium: 139
TCO2: 25
pH, Ven: 7.377 — ABNORMAL HIGH

## 2010-11-29 LAB — DIFFERENTIAL
Basophils Relative: 3 — ABNORMAL HIGH
Eosinophils Absolute: 0.9 — ABNORMAL HIGH
Monocytes Absolute: 1 — ABNORMAL HIGH
Monocytes Relative: 13 — ABNORMAL HIGH
Neutrophils Relative %: 39 — ABNORMAL LOW

## 2010-11-29 LAB — CBC
Platelets: 308
RDW: 14.6 — ABNORMAL HIGH

## 2011-05-01 ENCOUNTER — Observation Stay (HOSPITAL_COMMUNITY)
Admission: EM | Admit: 2011-05-01 | Discharge: 2011-05-01 | Disposition: A | Discharge status: Home Health | Payer: BC Managed Care – PPO | Source: Ambulatory Visit | Attending: Emergency Medicine | Admitting: Emergency Medicine

## 2011-05-01 ENCOUNTER — Emergency Department (HOSPITAL_COMMUNITY): Payer: BC Managed Care – PPO

## 2011-05-01 ENCOUNTER — Encounter (HOSPITAL_COMMUNITY): Payer: Self-pay

## 2011-05-01 DIAGNOSIS — J45901 Unspecified asthma with (acute) exacerbation: Secondary | ICD-10-CM

## 2011-05-01 DIAGNOSIS — IMO0002 Reserved for concepts with insufficient information to code with codable children: Secondary | ICD-10-CM | POA: Insufficient documentation

## 2011-05-01 DIAGNOSIS — R0602 Shortness of breath: Secondary | ICD-10-CM | POA: Insufficient documentation

## 2011-05-01 DIAGNOSIS — J45909 Unspecified asthma, uncomplicated: Principal | ICD-10-CM | POA: Insufficient documentation

## 2011-05-01 DIAGNOSIS — Z79899 Other long term (current) drug therapy: Secondary | ICD-10-CM | POA: Insufficient documentation

## 2011-05-01 DIAGNOSIS — Z7982 Long term (current) use of aspirin: Secondary | ICD-10-CM | POA: Insufficient documentation

## 2011-05-01 LAB — CBC
HCT: 38.5 % — ABNORMAL LOW (ref 39.0–52.0)
Hemoglobin: 13.1 g/dL (ref 13.0–17.0)
MCV: 77.5 fL — ABNORMAL LOW (ref 78.0–100.0)
RBC: 4.97 MIL/uL (ref 4.22–5.81)
WBC: 11.3 10*3/uL — ABNORMAL HIGH (ref 4.0–10.5)

## 2011-05-01 LAB — BASIC METABOLIC PANEL
BUN: 13 mg/dL (ref 6–23)
CO2: 22 mEq/L (ref 19–32)
Chloride: 102 mEq/L (ref 96–112)
Creatinine, Ser: 1.02 mg/dL (ref 0.50–1.35)

## 2011-05-01 MED ORDER — IPRATROPIUM BROMIDE 0.02 % IN SOLN
RESPIRATORY_TRACT | Status: AC
  Filled 2011-05-01: qty 2.5

## 2011-05-01 MED ORDER — METHYLPREDNISOLONE SODIUM SUCC 125 MG IJ SOLR
INTRAMUSCULAR | Status: AC
  Filled 2011-05-01: qty 2

## 2011-05-01 MED ORDER — ALBUTEROL SULFATE (5 MG/ML) 0.5% IN NEBU
INHALATION_SOLUTION | RESPIRATORY_TRACT | Status: AC
  Filled 2011-05-01: qty 2

## 2011-05-01 MED ORDER — PREDNISONE (PAK) 10 MG PO TABS: 10.0000 mg | ORAL_TABLET | Freq: Every day | ORAL | Status: DC

## 2011-05-01 MED ORDER — PREDNISONE (PAK) 10 MG PO TABS: 10.0000 mg | ORAL_TABLET | Freq: Every day | ORAL | Status: AC

## 2011-05-01 MED ORDER — ALBUTEROL SULFATE (5 MG/ML) 0.5% IN NEBU
2.5000 mg | INHALATION_SOLUTION | RESPIRATORY_TRACT | Status: DC
  Administered 2011-05-01: 2.5 mg via RESPIRATORY_TRACT
  Filled 2011-05-01: qty 0.5

## 2011-05-01 MED ORDER — ALBUTEROL SULFATE HFA 108 (90 BASE) MCG/ACT IN AERS: 2.0000 | INHALATION_SPRAY | RESPIRATORY_TRACT | Status: AC | PRN

## 2011-05-01 MED ORDER — ALBUTEROL SULFATE (5 MG/ML) 0.5% IN NEBU
5.0000 mg | INHALATION_SOLUTION | Freq: Once | RESPIRATORY_TRACT | Status: AC
  Administered 2011-05-01: 5 mg via RESPIRATORY_TRACT
  Filled 2011-05-01: qty 1
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
## 2011-05-01 NOTE — ED Provider Notes (Signed)
7:47 AM patient is in CDU under observation, asthma protocol.  Patient reports he is much improved from yesterday though he continues to have some tightness in his chest.  He has a nebulizer machine at home and may prefer to go home this morning.  On exam, pt is A&Ox4, NAD, RRR, lungs with slight end expiratory wheezes diffusely.  O2 saturation between 95-97% on room air.  Will continue to follow.    Patient decided that he was ready to be discharged home.   Pt d/c home with medications and PCP follow up.  Patient verbalizes understanding and agrees with plan.    Rise Patience, [place] 05/01/11 1518  Medical screening examination/treatment/procedure(s) were performed by non-physician practitioner and as supervising physician I was immediately available for consultation/collaboration.  Sunnie Nielsen, MD 05/03/11 [phone]

## 2011-05-01 NOTE — ED Notes (Signed)
Pt wanting to go home. States will do neb tx at home. PA aware.

## 2011-05-01 NOTE — ED Notes (Signed)
Report given to Vickey Huger, RN in CDU. Pt currently receiving neb treatment. Will transport to CDU #1 when complete.
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
## 2011-05-01 NOTE — ED Notes (Signed)
Called to pt room in ed for Asthma protocol. Pt receiving neb tx. Post Peep Flow 300x 3 with good effort. Clear breath sounds pt sats 100% on 2 lt nasal cannula. Pt in no distress at this time.

## 2011-05-01 NOTE — ED Notes (Signed)
Phoned resp tech -- informed her of asthma protocol and need for peak flow now and every hour x 2, then every 2-4 hours per order.s

## 2011-05-01 NOTE — Discharge Instructions (Signed)
Please use your nebulizer every 4 hours as needed for shortness of breath and coughing.  Read the information below.  You may return to the ER at any time for worsening condition or any new symptoms that concern you.     Asthma, Adult Asthma is caused by narrowing of the air passages in the lungs. It may be triggered by pollen, dust, animal dander, molds, some foods, respiratory infections, exposure to smoke, exercise, emotional stress or other allergens (things that cause allergic reactions or allergies). Repeat attacks are common. HOME CARE INSTRUCTIONS   Use prescription medications as ordered by your caregiver.   Avoid pollen, dust, animal dander, molds, smoke and other things that cause attacks at home and at work.   You may have fewer attacks if you decrease dust in your home. Electrostatic air cleaners may help.   It may help to replace your pillows or mattress with materials less likely to cause allergies.   Talk to your caregiver about an action plan for managing asthma attacks at home, including, the use of a peak flow meter which measures the severity of your asthma attack. An action plan can help minimize or stop the attack without having to seek medical care.   If you are not on a fluid restriction, drink 8 to 10 glasses of water each day.   Always have a plan prepared for seeking medical attention, including, calling your physician, accessing local emergency care, and calling 911 (in the [place] for a severe attack.   Discuss possible exercise routines with your caregiver.   If animal dander is the cause of asthma, you may need to get rid of pets.  SEEK MEDICAL CARE IF:   You have wheezing and shortness of breath even if taking medicine to prevent attacks.   You have muscle aches, chest pain or thickening of sputum.   Your sputum changes from clear or white to yellow, green, gray, or bloody.   You have any problems that may be related to the medicine you are taking (such  as a rash, itching, swelling or trouble breathing).  SEEK IMMEDIATE MEDICAL CARE IF:   Your usual medicines do not stop your wheezing or there is increased coughing and/or shortness of breath.   You have increased difficulty breathing.   You have a fever.  MAKE SURE YOU:   Understand these instructions.   Will watch your condition.   Will get help right away if you are not doing well or get worse.  Document Released: 01/29/2005 Document Revised: 01/18/2011 Document Reviewed: 09/17/2007 [facility] Patient Information 2012 [place], [place], [place].

## 2011-05-04 NOTE — Progress Notes (Signed)
Observation review for 05/01/2011 is complete.

## 2011-06-08 ENCOUNTER — Emergency Department (HOSPITAL_COMMUNITY)
Admission: EM | Admit: 2011-06-08 | Discharge: 2011-06-08 | Disposition: A | Discharge status: Home / Self Care | Payer: BC Managed Care – PPO | Attending: Emergency Medicine | Admitting: Emergency Medicine

## 2011-06-08 ENCOUNTER — Emergency Department (HOSPITAL_COMMUNITY): Payer: BC Managed Care – PPO

## 2011-06-08 ENCOUNTER — Encounter (HOSPITAL_COMMUNITY): Payer: Self-pay | Admitting: Emergency Medicine

## 2011-06-08 DIAGNOSIS — R0602 Shortness of breath: Secondary | ICD-10-CM | POA: Insufficient documentation

## 2011-06-08 DIAGNOSIS — R05 Cough: Secondary | ICD-10-CM | POA: Insufficient documentation

## 2011-06-08 DIAGNOSIS — J45901 Unspecified asthma with (acute) exacerbation: Secondary | ICD-10-CM | POA: Insufficient documentation

## 2011-06-08 DIAGNOSIS — Z79899 Other long term (current) drug therapy: Secondary | ICD-10-CM | POA: Insufficient documentation

## 2011-06-08 DIAGNOSIS — R059 Cough, unspecified: Secondary | ICD-10-CM | POA: Insufficient documentation

## 2011-06-08 MED ORDER — METHYLPREDNISOLONE SODIUM SUCC 125 MG IJ SOLR
125.0000 mg | Freq: Once | INTRAMUSCULAR | Status: AC
  Administered 2011-06-08: 125 mg via INTRAMUSCULAR
  Filled 2011-06-08: qty 2

## 2011-06-08 MED ORDER — PREDNISONE 10 MG PO TABS: ORAL_TABLET | ORAL | Status: AC

## 2011-06-08 MED ORDER — ALBUTEROL SULFATE (5 MG/ML) 0.5% IN NEBU
10.0000 mg | INHALATION_SOLUTION | Freq: Once | RESPIRATORY_TRACT | Status: AC
  Administered 2011-06-08: 10 mg via RESPIRATORY_TRACT
  Filled 2011-06-08: qty 2

## 2011-06-08 MED ORDER — IPRATROPIUM BROMIDE 0.02 % IN SOLN
0.5000 mg | Freq: Once | RESPIRATORY_TRACT | Status: AC
  Administered 2011-06-08: 0.5 mg via RESPIRATORY_TRACT
  Filled 2011-06-08: qty 2.5

## 2011-06-08 MED ORDER — ALBUTEROL SULFATE (5 MG/ML) 0.5% IN NEBU
5.0000 mg | INHALATION_SOLUTION | Freq: Once | RESPIRATORY_TRACT | Status: AC
  Administered 2011-06-08: 5 mg via RESPIRATORY_TRACT
  Filled 2011-06-08: qty 1

## 2011-06-08 NOTE — Discharge Instructions (Signed)
Asthma Attack Prevention HOW CAN ASTHMA BE PREVENTED? Currently, there is no way to prevent asthma from starting. However, you can take steps to control the disease and prevent its symptoms after you have been diagnosed. Learn about your asthma and how to control it. Take an active role to control your asthma by working with your caregiver to create and follow an asthma action plan. An asthma action plan guides you in taking your medicines properly, avoiding factors that make your asthma worse, tracking your level of asthma control, responding to worsening asthma, and seeking emergency care when needed. To track your asthma, keep records of your symptoms, check your peak flow number using a peak flow meter (handheld device that shows how well air moves out of your lungs), and get regular asthma checkups.  Other ways to prevent asthma attacks include:  Use medicines as your caregiver directs.   Identify and avoid things that make your asthma worse (as much as you can).   Keep track of your asthma symptoms and level of control.   Get regular checkups for your asthma.   With your caregiver, write a detailed plan for taking medicines and managing an asthma attack. Then be sure to follow your action plan. Asthma is an ongoing condition that needs regular monitoring and treatment.   Identify and avoid asthma triggers. A number of outdoor allergens and irritants (pollen, mold, cold air, air pollution) can trigger asthma attacks. Find out what causes or makes your asthma worse, and take steps to avoid those triggers (see below).   Monitor your breathing. Learn to recognize warning signs of an attack, such as slight coughing, wheezing or shortness of breath. However, your lung function may already decrease before you notice any signs or symptoms, so regularly measure and record your peak airflow with a home peak flow meter.   Identify and treat attacks early. If you act quickly, you're less likely to have  a severe attack. You will also need less medicine to control your symptoms. When your peak flow measurements decrease and alert you to an upcoming attack, take your medicine as instructed, and immediately stop any activity that may have triggered the attack. If your symptoms do not improve, get medical help.   Pay attention to increasing quick-relief inhaler use. If you find yourself relying on your quick-relief inhaler (such as albuterol), your asthma is not under control. See your caregiver about adjusting your treatment.  IDENTIFY AND CONTROL FACTORS THAT MAKE YOUR ASTHMA WORSE A number of common things can set off or make your asthma symptoms worse (asthma triggers). Keep track of your asthma symptoms for several weeks, detailing all the environmental and emotional factors that are linked with your asthma. When you have an asthma attack, go back to your asthma diary to see which factor, or combination of factors, might have contributed to it. Once you know what these factors are, you can take steps to control many of them.  Allergies: If you have allergies and asthma, it is important to take asthma prevention steps at home. Asthma attacks (worsening of asthma symptoms) can be triggered by allergies, which can cause temporary increased inflammation of your airways. Minimizing contact with the substance to which you are allergic will help prevent an asthma attack. Animal Dander:   Some people are allergic to the flakes of skin or dried saliva from animals with fur or feathers. Keep these pets out of your home.   If you can't keep a pet outdoors, keep the   pet out of your bedroom and other sleeping areas at all times, and keep the door closed.   Remove carpets and furniture covered with cloth from your home. If that is not possible, keep the pet away from fabric-covered furniture and carpets.  Dust Mites:  Many people with asthma are allergic to dust mites. Dust mites are tiny bugs that are found in  every home, in mattresses, pillows, carpets, fabric-covered furniture, bedcovers, clothes, stuffed toys, fabric, and other fabric-covered items.   Cover your mattress in a special dust-proof cover.   Cover your pillow in a special dust-proof cover, or wash the pillow each week in hot water. Water must be hotter than 130 F to kill dust mites. Cold or warm water used with detergent and bleach can also be effective.   Wash the sheets and blankets on your bed each week in hot water.   Try not to sleep or lie on cloth-covered cushions.   Call ahead when traveling and ask for a smoke-free hotel room. Bring your own bedding and pillows, in case the hotel only supplies feather pillows and down comforters, which may contain dust mites and cause asthma symptoms.   Remove carpets from your bedroom and those laid on concrete, if you can.   Keep stuffed toys out of the bed, or wash the toys weekly in hot water or cooler water with detergent and bleach.  Cockroaches:  Many people with asthma are allergic to the droppings and remains of cockroaches.   Keep food and garbage in closed containers. Never leave food out.   Use poison baits, traps, powders, gels, or paste (for example, boric acid).   If a spray is used to kill cockroaches, stay out of the room until the odor goes away.  Indoor Mold:  Fix leaky faucets, pipes, or other sources of water that have mold around them.   Clean moldy surfaces with a cleaner that has bleach in it.  Pollen and Outdoor Mold:  When pollen or mold spore counts are high, try to keep your windows closed.   Stay indoors with windows closed from late morning to afternoon, if you can. Pollen and some mold spore counts are highest at that time.   Ask your caregiver whether you need to take or increase anti-inflammatory medicine before your allergy season starts.  Irritants:   Tobacco smoke is an irritant. If you smoke, ask your caregiver how you can quit. Ask family  members to quit smoking, too. Do not allow smoking in your home or car.   If possible, do not use a wood-burning stove, kerosene heater, or fireplace. Minimize exposure to all sources of smoke, including incense, candles, fires, and fireworks.   Try to stay away from strong odors and sprays, such as perfume, talcum powder, hair spray, and paints.   Decrease humidity in your home and use an indoor air cleaning device. Reduce indoor humidity to below 60 percent. Dehumidifiers or central air conditioners can do this.   Try to have someone else vacuum for you once or twice a week, if you can. Stay out of rooms while they are being vacuumed and for a short while afterward.   If you vacuum, use a dust mask from a hardware store, a double-layered or microfilter vacuum cleaner bag, or a vacuum cleaner with a HEPA filter.   Sulfites in foods and beverages can be irritants. Do not drink beer or wine, or eat dried fruit, processed potatoes, or shrimp if they cause asthma   symptoms.   Cold air can trigger an asthma attack. Cover your nose and mouth with a scarf on cold or windy days.   Several health conditions can make asthma more difficult to manage, including runny nose, sinus infections, reflux disease, psychological stress, and sleep apnea. Your caregiver will treat these conditions, as well.   Avoid close contact with people who have a cold or the flu, since your asthma symptoms may get worse if you catch the infection from them. Wash your hands thoroughly after touching items that may have been handled by people with a respiratory infection.   Get a flu shot every year to protect against the flu virus, which often makes asthma worse for days or weeks. Also get a pneumonia shot once every five to 10 years.  Drugs:  Aspirin and other painkillers can cause asthma attacks. 10% to 20% of people with asthma have sensitivity to aspirin or a group of painkillers called non-steroidal anti-inflammatory drugs  (NSAIDS), such as ibuprofen and naproxen. These drugs are used to treat pain and reduce fevers. Asthma attacks caused by any of these medicines can be severe and even fatal. These drugs must be avoided in people who have known aspirin sensitive asthma. Products with acetaminophen are considered safe for people who have asthma. It is important that people with aspirin sensitivity read labels of all over-the-counter drugs used to treat pain, colds, coughs, and fever.   Beta blockers and ACE inhibitors are other drugs which you should discuss with your caregiver, in relation to your asthma.  ALLERGY SKIN TESTING  Ask your asthma caregiver about allergy skin testing or blood testing (RAST test) to identify the allergens to which you are sensitive. If you are found to have allergies, allergy shots (immunotherapy) for asthma may help prevent future allergies and asthma. With allergy shots, small doses of allergens (substances to which you are allergic) are injected under your skin on a regular schedule. Over a period of time, your body may become used to the allergen and less responsive with asthma symptoms. You can also take measures to minimize your exposure to those allergens. EXERCISE  If you have exercise-induced asthma, or are planning vigorous exercise, or exercise in cold, humid, or dry environments, prevent exercise-induced asthma by following your caregiver's advice regarding asthma treatment before exercising. Document Released: 01/17/2009 Document Revised: 01/18/2011 Document Reviewed: 01/17/2009 [employer] Patient Information 2012 [employer]. 

## 2011-06-08 NOTE — ED Provider Notes (Signed)
History     CSN: [id number]  Arrival date & time 06/08/11  0224   First MD Initiated Contact with Patient 06/08/11 0246      Chief Complaint  Patient presents with  . Shortness of Breath    (Consider location/radiation/quality/duration/timing/severity/associated sxs/prior treatment) HPI Is a 56 year old black male with a history of asthma. He states his breathing acutely worsened about midnight. He treated himself with albuterol without adequate relief so he came here. The symptoms are moderate at this point that he was afraid to wait for fear they would become severe. He denies chest pain, fever, chills, nausea or vomiting. He has had a cough productive of clear sputum.  Past Medical History  Diagnosis Date  . Asthma     Past Surgical History  Procedure Date  . Hip resurfacing     R hip    History reviewed. No pertinent family history.  History  Substance Use Topics  . Smoking status: Never Smoker   . Smokeless tobacco: Not on file  . Alcohol Use: No      Review of Systems  All other systems reviewed and are negative.    Allergies  Lasix  Home Medications   Current Outpatient Rx  Name Route Sig Dispense Refill  . ALBUTEROL SULFATE HFA 108 (90 BASE) MCG/ACT IN AERS Inhalation Inhale 2 puffs into the lungs every 4 (four) hours as needed for wheezing or shortness of breath. 1 Inhaler 0  . ASPIRIN EC 81 MG PO TBEC Oral Take 81 mg by mouth daily.    Marland Kitchen DM-GUAIFENESIN ER 30-600 MG PO TB12 Oral Take 1 tablet by mouth every 12 (twelve) hours.    Marland Kitchen FLUTICASONE PROPIONATE 50 MCG/ACT NA SUSP Nasal Place 2 sprays into the nose every evening.    . ADULT MULTIVITAMIN W/MINERALS CH Oral Take 1 tablet by mouth daily.    Marland Kitchen PREDNISONE 10 MG PO TABS Oral Take 10 mg by mouth daily as needed. For asthma      BP 153/67  Pulse 70  Temp(Src) 97.6 F (36.4 C) (Oral)  Resp 18  SpO2 96%  Physical Exam General: Well-developed, well-nourished male in no acute distress;  appearance consistent with age of record HENT: normocephalic, atraumatic Eyes: pupils equal round and reactive to light; extraocular muscles intact Neck: supple Heart: regular rate and rhythm Lungs: Expiratory wheezes with decreased air movement bilaterally Abdomen: soft; nondistended Extremities: No deformity; full range of motion; pulses normal Neurologic: Awake, alert and oriented; motor function intact in all extremities and symmetric; no facial droop Skin: Warm and dry Psychiatric: Normal mood and affect    ED Course  Procedures (including critical care time)     MDM   Nursing notes and vitals signs, including pulse oximetry, reviewed.  Summary of this visit's results, reviewed by myself:  Imaging Studies: Dg Chest 2 View  06/08/2011  *RADIOLOGY REPORT*  Clinical Data: Cough and shortness of breath  CHEST - 2 VIEW  Comparison: 05/01/2011  Findings: Normal heart size and pulmonary vascularity.  No focal airspace consolidation in the lungs.  No blunting of costophrenic angles.  No pneumothorax.  Central peribronchial thickening suggesting changes of chronic bronchitis.  No significant change since previous study.  IMPRESSION: Chronic bronchitic changes.  No evidence of active pulmonary disease.  Original Report Authenticated By: Marlon Pel, M.D.   4:52 AM Patient feels much better after continuous neb treatment. Lungs are clear except for minimal expiratory rhonchi. Patient states he feels back to his baseline.  Hanley Seamen, MD 06/08/11 [phone]

## 2011-06-08 NOTE — ED Notes (Signed)
Pt states he woke feeling SOB.  Pt used inhaler but did not help with breathing.  Placed pt on 2L [place] for comfort.  RT called for breathing treatment.

## 2011-06-08 NOTE — ED Notes (Addendum)
Pt reports waking up feeling SOB- took albuterol at home, and used his rescue inhaler- with no relief; then started to feel dizzy and felt like he was going to pass out; pt reports slight pain in abd; pt reports cough with clear phlegm; denies fevers; denies cp; pt with exp wheezing in lung fields

## 2011-06-08 NOTE — ED Notes (Signed)
Respiratory called to administer breathing tx.

## 2011-06-08 NOTE — ED Notes (Signed)
Pt ambulated with a steady gait; VSS; A&Ox3; no signs of distress; respirations even and unlabored; respirations even and unlabored; breathe sounds clear; pt able to walk around without getting winded. No questions at this time.

## 2015-11-21 ENCOUNTER — Other Ambulatory Visit: Payer: Self-pay | Admitting: Family Medicine

## 2015-11-21 DIAGNOSIS — R202 Paresthesia of skin: Secondary | ICD-10-CM

## 2015-11-29 ENCOUNTER — Ambulatory Visit
Admission: RE | Admit: 2015-11-29 | Discharge: 2015-11-29 | Disposition: A | Discharge status: Home / Self Care | Payer: BC Managed Care – PPO | Source: Ambulatory Visit | Attending: Family Medicine | Admitting: Family Medicine

## 2015-11-29 DIAGNOSIS — R202 Paresthesia of skin: Secondary | ICD-10-CM

## 2015-11-29 MED ORDER — GADOBENATE DIMEGLUMINE 529 MG/ML IV SOLN
20.0000 mL | Freq: Once | INTRAVENOUS | Status: AC | PRN
  Administered 2015-11-29: 17 mL via INTRAVENOUS

## 2015-11-30 ENCOUNTER — Ambulatory Visit
Admission: RE | Admit: 2015-11-30 | Discharge: 2015-11-30 | Disposition: A | Discharge status: Home / Self Care | Payer: BC Managed Care – PPO | Source: Ambulatory Visit | Attending: Family Medicine | Admitting: Family Medicine

## 2015-11-30 DIAGNOSIS — R202 Paresthesia of skin: Secondary | ICD-10-CM

## 2017-07-01 IMAGING — US US CAROTID DUPLEX BILAT
1 series · 13 of 24 positions shown · non-contrast
Comparison: Brain MRI - 11/29/2015

CLINICAL DATA: Intermittent paresthesias involving the left side of
the face for the past 6 months.

EXAM:
BILATERAL CAROTID DUPLEX ULTRASOUND
TECHNIQUE: Gray scale imaging, color Doppler and duplex ultrasound were
performed of bilateral carotid and vertebral arteries in the neck.

[Series 1: us carotid duplex bilat · 0.08mm/px · 13 of 73 slices shown]
[im 1/73]
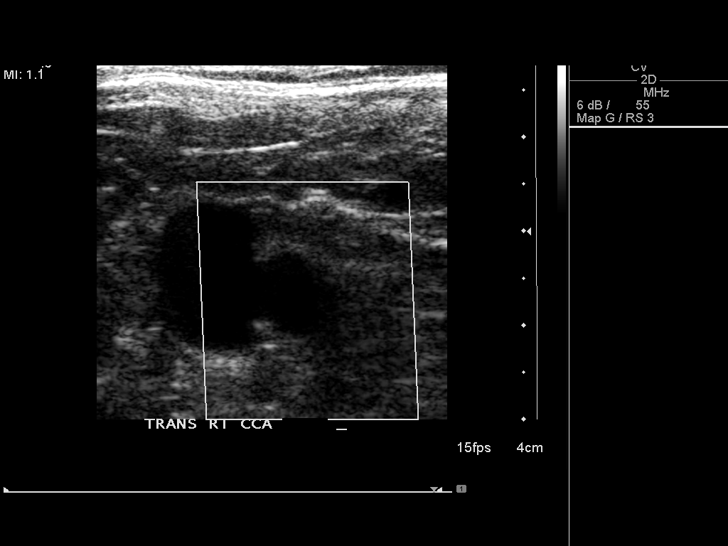
[im 7/73]
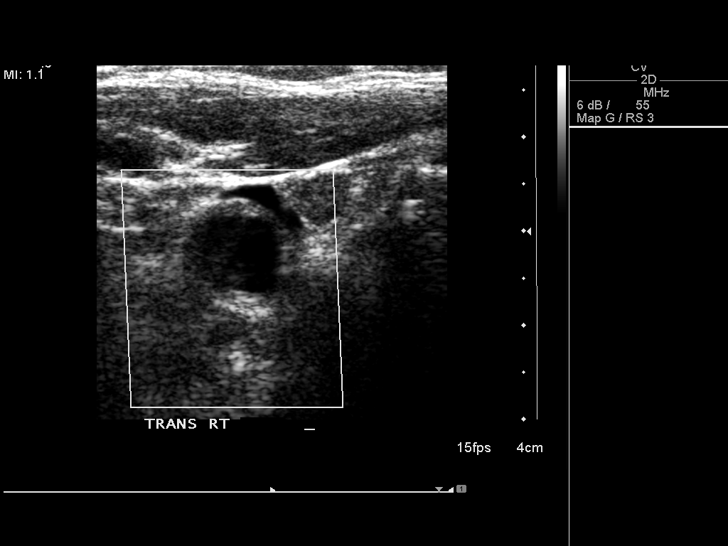
[im 13/73]
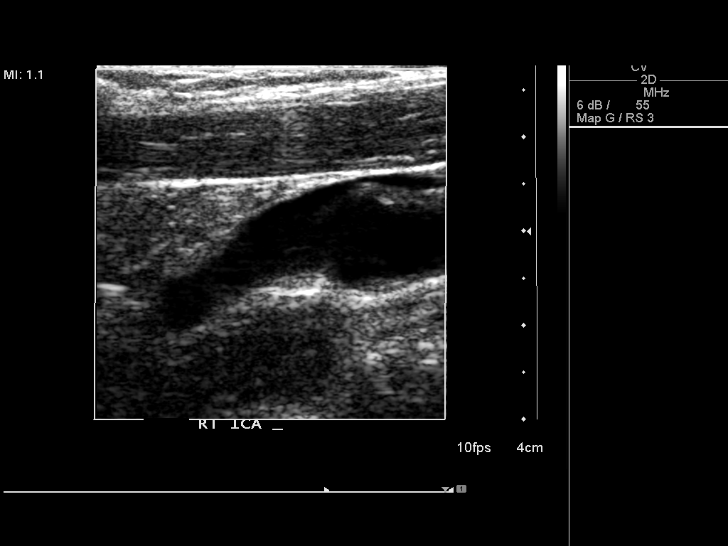
[im 19/73]
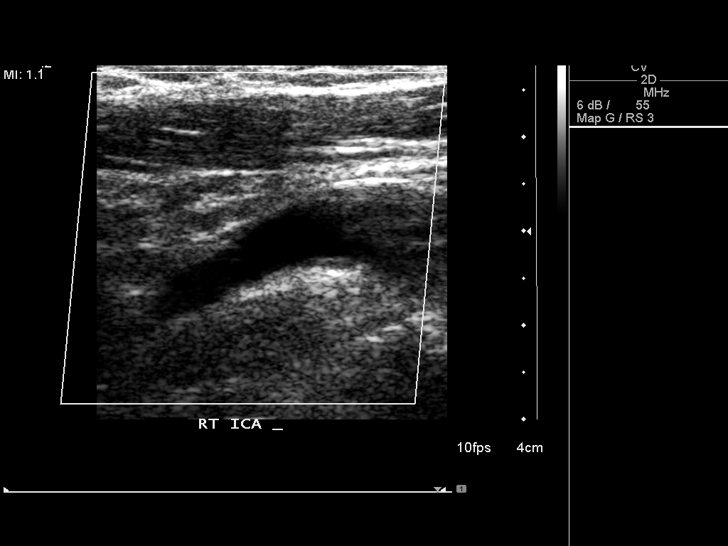
[im 26/73]
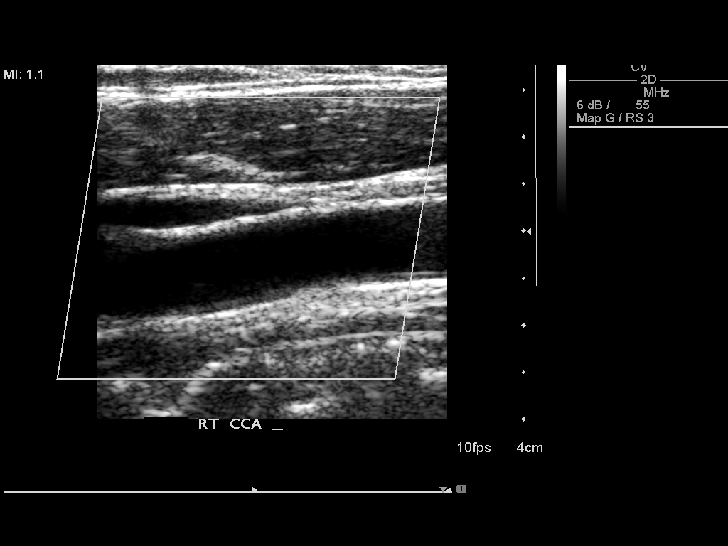
[im 32/73]
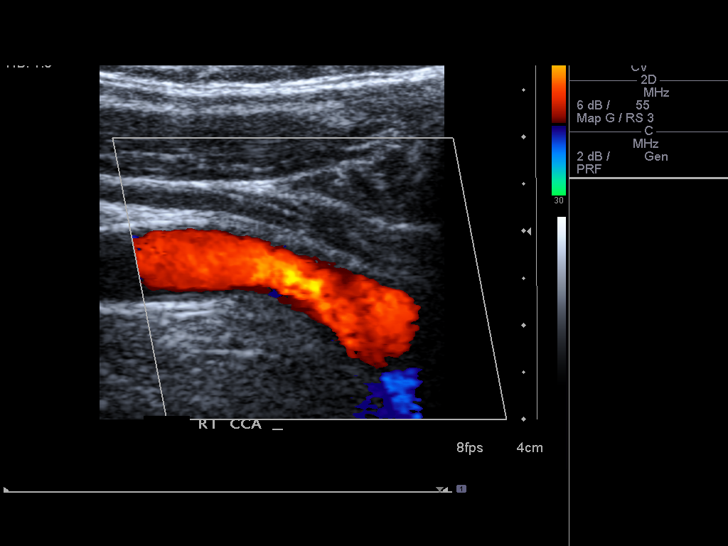
[im 38/73]
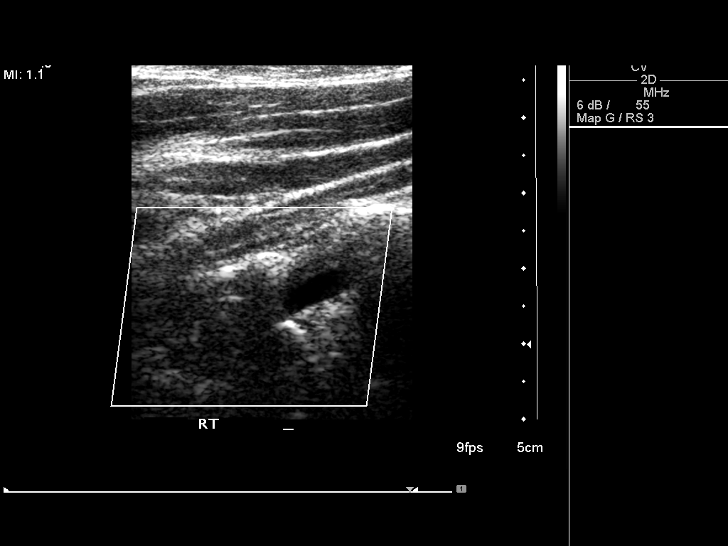
[im 41/73]
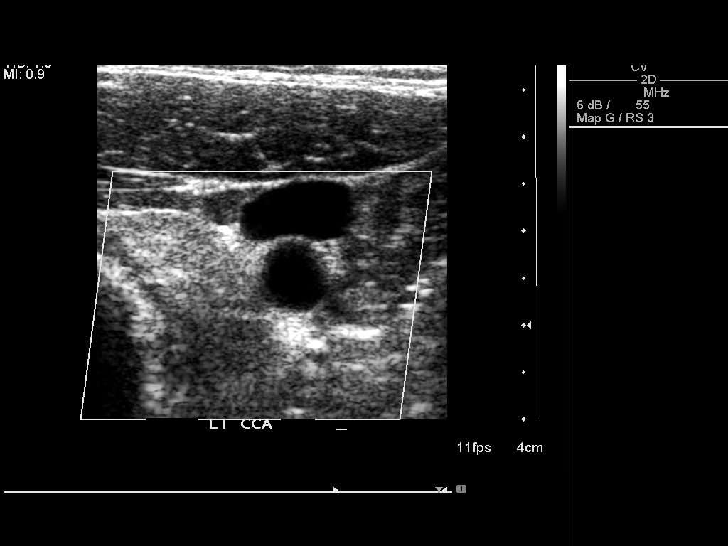
[im 47/73]
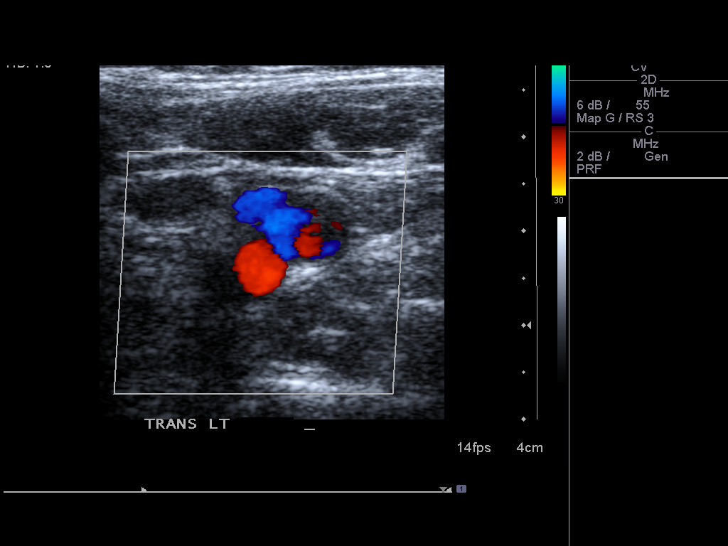
[im 54/73]
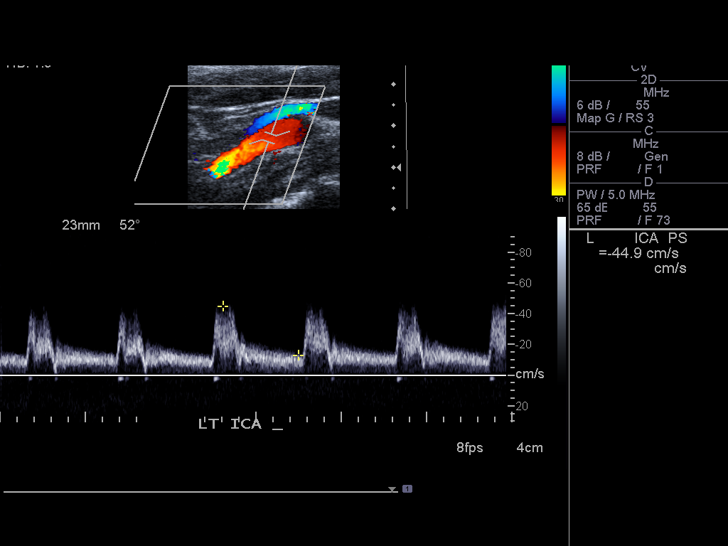
[im 60/73]
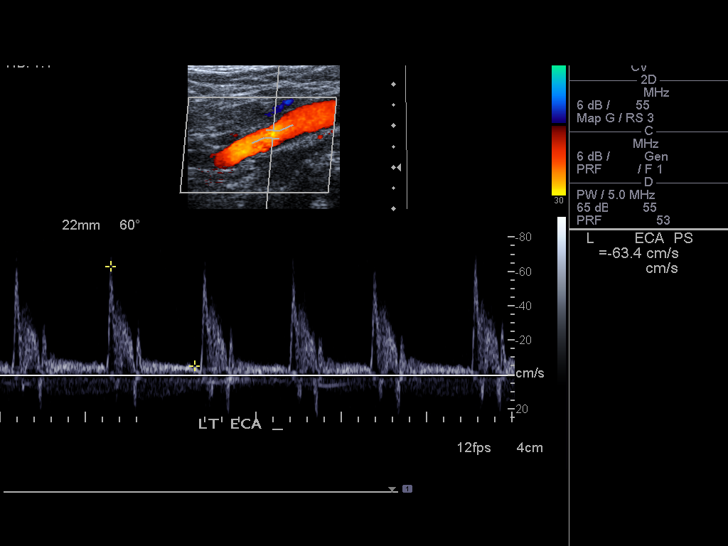
[im 66/73]
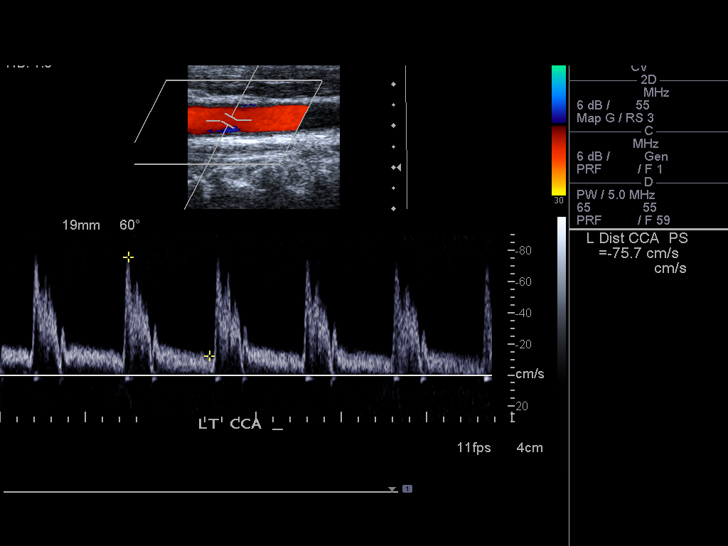
[im 73/73]
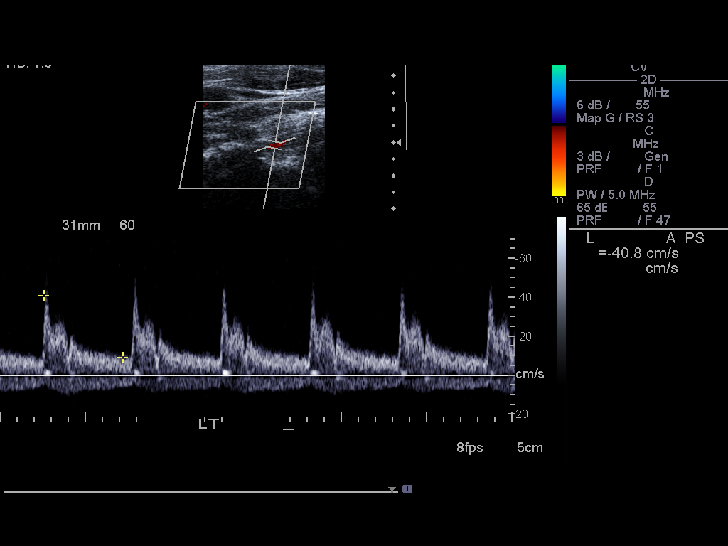

[13 of 24 positions shown; findings below may reference images not displayed]

FINDINGS: Criteria: Quantification of carotid stenosis is based on velocity
parameters that correlate the residual internal carotid diameter
with NASCET-based stenosis levels, using the diameter of the distal
internal carotid lumen as the denominator for stenosis measurement.

The following velocity measurements were obtained:

RIGHT

ICA:  93/22 cm/sec

CCA:  91/11 cm/sec

SYSTOLIC ICA/CCA RATIO:

DIASTOLIC ICA/CCA RATIO:

ECA:  87 cm/sec

LEFT

ICA:  104/34 cm/sec

CCA:  140/18 cm/sec

SYSTOLIC ICA/CCA RATIO:

DIASTOLIC ICA/CCA RATIO:

ECA:  63 cm/sec

RIGHT CAROTID ARTERY: There is a minimal amount of eccentric mixed
echogenic plaque within the right carotid bulb (Image 8), extending
to involve the origin and proximal aspects of the right internal
carotid artery (images 13 and 14), not resulting in elevated peak
systolic velocities in the interrogated course the right internal
carotid artery to suggest a hemodynamically significant stenosis.

RIGHT VERTEBRAL ARTERY:  Antegrade flow

LEFT CAROTID ARTERY: There is a minimal amount of eccentric mixed
echogenic plaque within the left carotid bulb (image 47), extending
to involve the origin and proximal aspects of the left internal
carotid artery (images 51 and 52), not resulting elevated peak
systolic velocities with the interrogated course the left internal
carotid artery to suggest a hemodynamically significant stenosis.

LEFT VERTEBRAL ARTERY:  Antegrade Flow
IMPRESSION: Minimal amount of bilateral atherosclerotic plaque, not resulting in
elevated peak systolic velocities within either internal carotid
artery.

## 2021-11-13 ENCOUNTER — Ambulatory Visit: Payer: Self-pay | Admitting: Podiatry

## 2022-03-07 ENCOUNTER — Ambulatory Visit: Payer: BC Managed Care – PPO | Admitting: Podiatry

## 2022-03-07 ENCOUNTER — Ambulatory Visit (INDEPENDENT_AMBULATORY_CARE_PROVIDER_SITE_OTHER): Payer: BC Managed Care – PPO

## 2022-03-07 VITALS — BP 115/60 | HR 60

## 2022-03-07 DIAGNOSIS — M79672 Pain in left foot: Secondary | ICD-10-CM

## 2022-03-07 DIAGNOSIS — M10072 Idiopathic gout, left ankle and foot: Secondary | ICD-10-CM

## 2022-03-07 DIAGNOSIS — M79671 Pain in right foot: Secondary | ICD-10-CM | POA: Diagnosis not present

## 2022-03-07 MED ORDER — COLCHICINE 0.6 MG PO TABS: 0.6000 mg | ORAL_TABLET | Freq: Every day | ORAL | 0 refills | Status: DC

## 2022-03-07 NOTE — Progress Notes (Signed)
   No chief complaint on file.   HPI: 67 y.o. male presenting today as a new patient for evaluation of pain and tenderness associated to the left foot that began Monday, 03/04/2022.  Idiopathic onset.  He states that he has been diagnosed with gout in the past.  Patient also states that he has experienced constant pulling and pain along the medial longitudinal arch of the feet bilaterally.  This is very low-grade but constant.  He presents for further treatment evaluation  Past Medical History:  Diagnosis Date   Asthma     Past Surgical History:  Procedure Laterality Date   HIP RESURFACING     R hip    Allergies  Allergen Reactions   Lasix [Furosemide]     Iv form     Physical Exam: General: The patient is alert and oriented x3 in no acute distress.  Dermatology: Skin is warm, dry and supple bilateral lower extremities. Negative for open lesions or macerations.  Vascular: Palpable pedal pulses bilaterally. Capillary refill within normal limits.  There is some erythema and edema localized to the left dorsum of the foot consistent with acute gout  Neurological: Grossly intact via light touch  Musculoskeletal Exam: No pedal deformities noted  Radiographic Exam B/L feet 03/07/2022:  Normal osseous mineralization. Joint spaces preserved. No fracture/dislocation/boney destruction.  Mild hallux valgus bunion deformity noted bilateral  Assessment: 1. Acute gout left foot 2. Arch pain bilateral   Plan of Care:  1. Patient evaluated. X-Rays reviewed.  2. Rx Cholchicine 0.6mg  daily until gout symptoms resolve 3. Recommend f/u and management with PCP for acute gout 4. Today we discussed custom orthotics to support the medial longitudinal arch of the foot and potentially alleviate pain and pressure during walking. Patient will consider.  5. Return to clinic PRN      Edrick Kins, DPM [facility]  Dr. Edrick Kins, DPM    [address], [place] [address]                Office [phone]  Fax [phone]

## 2022-03-07 NOTE — Addendum Note (Signed)
Addended by: Edrick Kins on: 03/07/2022 10:36 AM   Modules accepted: Orders

## 2022-03-21 ENCOUNTER — Ambulatory Visit (INDEPENDENT_AMBULATORY_CARE_PROVIDER_SITE_OTHER): Payer: BC Managed Care – PPO

## 2022-03-21 DIAGNOSIS — M79671 Pain in right foot: Secondary | ICD-10-CM

## 2022-03-21 DIAGNOSIS — M10072 Idiopathic gout, left ankle and foot: Secondary | ICD-10-CM

## 2022-03-21 NOTE — Progress Notes (Signed)
Patient presents today to be casted for custom molded orthotics. DR Amalia Hailey is the treating physician.  Impression foam cast was taken. ABN signed.  Patient info-  Shoe size: 10.5 M  Shoe style: DRESS  Height: 5FT 10IN  Weight: 190 LBS  Insurance: [place]    Patient will be notified once orthotics arrive in office and reappoint for fitting at that time.

## 2022-03-29 ENCOUNTER — Other Ambulatory Visit: Payer: Self-pay | Admitting: Podiatry

## 2022-03-29 NOTE — Telephone Encounter (Signed)
Please advise 

## 2022-03-29 NOTE — Telephone Encounter (Signed)
Is requesting a 90 day supply,originally sent 30 day but 90 day is the request

## 2022-03-29 NOTE — Telephone Encounter (Signed)
Sorry, has already been taken care of

## 2022-04-25 ENCOUNTER — Ambulatory Visit (INDEPENDENT_AMBULATORY_CARE_PROVIDER_SITE_OTHER): Payer: BC Managed Care – PPO | Admitting: Podiatry

## 2022-04-25 DIAGNOSIS — M10072 Idiopathic gout, left ankle and foot: Secondary | ICD-10-CM

## 2022-11-13 NOTE — Progress Notes (Signed)
Seen by casting department

## 2023-03-27 ENCOUNTER — Encounter: Payer: Self-pay | Admitting: Podiatry

## 2023-03-27 ENCOUNTER — Ambulatory Visit (INDEPENDENT_AMBULATORY_CARE_PROVIDER_SITE_OTHER): Payer: 59

## 2023-03-27 ENCOUNTER — Ambulatory Visit: Payer: 59 | Admitting: Podiatry

## 2023-03-27 DIAGNOSIS — M7742 Metatarsalgia, left foot: Secondary | ICD-10-CM | POA: Diagnosis not present

## 2023-03-27 DIAGNOSIS — M7741 Metatarsalgia, right foot: Secondary | ICD-10-CM

## 2023-03-27 DIAGNOSIS — M778 Other enthesopathies, not elsewhere classified: Secondary | ICD-10-CM

## 2023-03-27 NOTE — Patient Instructions (Signed)
Powerstep, spenco, protalus, superfeet are all good brands. Available on [employer].  You also want to purchase some with 'metatarsal pads'.

## 2023-03-27 NOTE — Progress Notes (Signed)
   Chief Complaint  Patient presents with   Foot Pain    RM#6 Left foot pain feeling like walking on pebbles worried about bone spurs,right foot hammer toes feels like they close up during the course of the day.    HPI: 68 y.o. male presenting today for follow-up evaluation of bilateral forefoot discomfort.  Last seen in the office early 2024 for acute gout onset.  He is doing well in regards to the gout.  He continues to have some pain and tenderness associated to the bilateral forefoot however.  Patient wears dress shoes daily.  Past Medical History:  Diagnosis Date   Asthma     Past Surgical History:  Procedure Laterality Date   HIP RESURFACING     R hip    Allergies  Allergen Reactions   Lasix [Furosemide]     Iv form     Physical Exam: General: The patient is alert and oriented x3 in no acute distress.  Dermatology: Skin is warm, dry and supple bilateral lower extremities.   Vascular: Palpable pedal pulses bilaterally. Capillary refill within normal limits.  No appreciable edema.  No erythema.  Neurological: Grossly intact via light touch  Musculoskeletal Exam: Reducible hammertoe deformity noted to the lesser digits bilateral.  There is some tenderness with palpation to the lesser MTPs of the bilateral foot as well.  Radiographic Exam B/L feet 03/27/2023:  Normal osseous mineralization. Joint spaces preserved.  No fractures or osseous irregularities noted.  Impression: Negative  Assessment/Plan of Care: 1.  Metatarsalgia bilateral 2.  Hammertoes lesser digits bilateral  -Patient evaluated.  X-rays reviewed -Recommend conservative treatment for now.  Advised against wearing business tight dress shoes that do not provide any support or cushion in the sole -Continue custom orthotics -Recommend possible metatarsal pads to offload additional pressure from the forefoot -Advise against going barefoot.  Patient wears OOFOS around the house -Return to clinic as  needed        Felecia Shelling, DPM [facility]  Dr. Felecia Shelling, DPM    [address] [place], [place] [address]                Office [phone]  Fax [phone]

## 2023-07-17 ENCOUNTER — Other Ambulatory Visit: Payer: Self-pay | Admitting: Podiatry

## 2024-01-13 ENCOUNTER — Other Ambulatory Visit: Payer: Self-pay | Admitting: Podiatry
